# Patient Record
Sex: Male | Born: 1950 | Race: White | Hispanic: No | Marital: Married | Smoking: Current some day smoker
Health system: Southern US, Community
[De-identification: ages and names within clinical notes are randomized; demographics above are authoritative.]

## PROBLEM LIST (undated history)

## (undated) DIAGNOSIS — I2510 Atherosclerotic heart disease of native coronary artery without angina pectoris: Secondary | ICD-10-CM

## (undated) DIAGNOSIS — I739 Peripheral vascular disease, unspecified: Secondary | ICD-10-CM

## (undated) DIAGNOSIS — E291 Testicular hypofunction: Secondary | ICD-10-CM

## (undated) DIAGNOSIS — I10 Essential (primary) hypertension: Secondary | ICD-10-CM

## (undated) DIAGNOSIS — D510 Vitamin B12 deficiency anemia due to intrinsic factor deficiency: Secondary | ICD-10-CM

## (undated) DIAGNOSIS — E785 Hyperlipidemia, unspecified: Secondary | ICD-10-CM

## (undated) DIAGNOSIS — I340 Nonrheumatic mitral (valve) insufficiency: Secondary | ICD-10-CM

## (undated) DIAGNOSIS — Z720 Tobacco use: Secondary | ICD-10-CM

## (undated) DIAGNOSIS — E119 Type 2 diabetes mellitus without complications: Secondary | ICD-10-CM

## (undated) DIAGNOSIS — E039 Hypothyroidism, unspecified: Secondary | ICD-10-CM

## (undated) HISTORY — DX: Testicular hypofunction: E29.1

## (undated) HISTORY — PX: CHOLECYSTECTOMY: SHX55

## (undated) HISTORY — DX: Nonrheumatic mitral (valve) insufficiency: I34.0

## (undated) HISTORY — DX: Hypothyroidism, unspecified: E03.9

## (undated) HISTORY — DX: Essential (primary) hypertension: I10

## (undated) HISTORY — PX: HAND TENDON SURGERY: SHX663

## (undated) HISTORY — DX: Vitamin B12 deficiency anemia due to intrinsic factor deficiency: D51.0

## (undated) HISTORY — DX: Type 2 diabetes mellitus without complications: E11.9

## (undated) HISTORY — DX: Peripheral vascular disease, unspecified: I73.9

## (undated) HISTORY — DX: Hyperlipidemia, unspecified: E78.5

## (undated) HISTORY — PX: CARDIAC CATHETERIZATION: SHX172

## (undated) HISTORY — DX: Tobacco use: Z72.0

## (undated) HISTORY — DX: Atherosclerotic heart disease of native coronary artery without angina pectoris: I25.10

---

## 2006-09-05 ENCOUNTER — Other Ambulatory Visit: Payer: Self-pay

## 2006-09-05 ENCOUNTER — Ambulatory Visit: Payer: Self-pay | Admitting: General Surgery

## 2006-09-06 ENCOUNTER — Ambulatory Visit: Payer: Self-pay | Admitting: General Surgery

## 2008-03-11 ENCOUNTER — Emergency Department: Payer: Self-pay | Admitting: Emergency Medicine

## 2008-03-13 ENCOUNTER — Ambulatory Visit: Payer: Self-pay | Admitting: Emergency Medicine

## 2009-12-22 ENCOUNTER — Ambulatory Visit: Payer: Self-pay | Admitting: General Surgery

## 2009-12-23 ENCOUNTER — Ambulatory Visit: Payer: Self-pay | Admitting: General Surgery

## 2014-09-23 ENCOUNTER — Emergency Department: Payer: Self-pay | Admitting: Emergency Medicine

## 2015-03-06 ENCOUNTER — Encounter: Payer: Self-pay | Admitting: Physician Assistant

## 2015-03-06 ENCOUNTER — Other Ambulatory Visit: Payer: Self-pay | Admitting: Physician Assistant

## 2015-03-06 ENCOUNTER — Observation Stay
Admit: 2015-03-06 | Disposition: A | Discharge status: Home / Self Care | Payer: Self-pay | Attending: Internal Medicine | Admitting: Internal Medicine

## 2015-03-06 DIAGNOSIS — E039 Hypothyroidism, unspecified: Secondary | ICD-10-CM | POA: Insufficient documentation

## 2015-03-06 DIAGNOSIS — R079 Chest pain, unspecified: Secondary | ICD-10-CM | POA: Diagnosis not present

## 2015-03-06 DIAGNOSIS — E291 Testicular hypofunction: Secondary | ICD-10-CM | POA: Insufficient documentation

## 2015-03-06 DIAGNOSIS — I10 Essential (primary) hypertension: Secondary | ICD-10-CM | POA: Diagnosis not present

## 2015-03-06 DIAGNOSIS — I340 Nonrheumatic mitral (valve) insufficiency: Secondary | ICD-10-CM | POA: Diagnosis not present

## 2015-03-06 DIAGNOSIS — E785 Hyperlipidemia, unspecified: Secondary | ICD-10-CM | POA: Diagnosis not present

## 2015-03-06 DIAGNOSIS — Z720 Tobacco use: Secondary | ICD-10-CM | POA: Insufficient documentation

## 2015-03-06 DIAGNOSIS — I2510 Atherosclerotic heart disease of native coronary artery without angina pectoris: Secondary | ICD-10-CM | POA: Diagnosis not present

## 2015-03-06 DIAGNOSIS — E119 Type 2 diabetes mellitus without complications: Secondary | ICD-10-CM | POA: Insufficient documentation

## 2015-03-06 LAB — CBC
HCT: 47 % (ref 40.0–52.0)
HGB: 15.5 g/dL (ref 13.0–18.0)
MCH: 32.9 pg (ref 26.0–34.0)
MCHC: 33 g/dL (ref 32.0–36.0)
MCV: 100 fL (ref 80–100)
Platelet: 247 10*3/uL (ref 150–440)
RBC: 4.72 10*6/uL (ref 4.40–5.90)
RDW: 14.1 % (ref 11.5–14.5)
WBC: 11.3 10*3/uL — AB (ref 3.8–10.6)

## 2015-03-06 LAB — PROTIME-INR
INR: 1
PROTHROMBIN TIME: 13.8 s

## 2015-03-06 LAB — HEPATIC FUNCTION PANEL A (ARMC)
ALBUMIN: 4 g/dL
AST: 20 U/L
Alkaline Phosphatase: 44 U/L
Bilirubin,Total: 0.2 mg/dL — ABNORMAL LOW
SGPT (ALT): 14 U/L — ABNORMAL LOW
Total Protein: 6.8 g/dL

## 2015-03-06 LAB — BASIC METABOLIC PANEL
ANION GAP: 8 (ref 7–16)
BUN: 12 mg/dL
CALCIUM: 9.3 mg/dL
CHLORIDE: 102 mmol/L
Co2: 28 mmol/L
Creatinine: 1.02 mg/dL
Glucose: 109 mg/dL — ABNORMAL HIGH
POTASSIUM: 3.5 mmol/L
SODIUM: 138 mmol/L

## 2015-03-06 LAB — LIPID PANEL
Cholesterol: 119 mg/dL
HDL Cholesterol: 37 mg/dL — ABNORMAL LOW
LDL CHOLESTEROL, CALC: 68 mg/dL
TRIGLYCERIDES: 72 mg/dL
VLDL Cholesterol, Calc: 14 mg/dL

## 2015-03-06 LAB — TROPONIN I
Troponin-I: <0.03 ng/mL
Troponin-I: <0.03 ng/mL
Troponin-I: <0.03 ng/mL

## 2015-03-07 ENCOUNTER — Telehealth: Payer: Self-pay

## 2015-03-07 NOTE — Telephone Encounter (Signed)
Attempted to contact pt regarding discharge from [facility] on 03/06/15. Left detailed message asking pt to call back w/ any questions or concerns regarding discharge instructions or medications.  Advised pt of appt w/ Dr. Kirke CorinArida 03/13/15 @ 9:15. Asked him to call back if he is unable to keep this appt.

## 2015-03-12 ENCOUNTER — Encounter: Payer: Self-pay | Admitting: *Deleted

## 2015-03-13 ENCOUNTER — Ambulatory Visit (INDEPENDENT_AMBULATORY_CARE_PROVIDER_SITE_OTHER): Payer: BLUE CROSS/BLUE SHIELD | Admitting: Cardiovascular Disease

## 2015-03-13 ENCOUNTER — Encounter: Payer: Self-pay | Admitting: Cardiovascular Disease

## 2015-03-13 ENCOUNTER — Encounter (INDEPENDENT_AMBULATORY_CARE_PROVIDER_SITE_OTHER): Payer: Self-pay

## 2015-03-13 VITALS — BP 122/66 | HR 88 | Ht 74.0 in | Wt 211.2 lb

## 2015-03-13 DIAGNOSIS — I10 Essential (primary) hypertension: Secondary | ICD-10-CM | POA: Diagnosis not present

## 2015-03-13 DIAGNOSIS — E785 Hyperlipidemia, unspecified: Secondary | ICD-10-CM | POA: Diagnosis not present

## 2015-03-13 DIAGNOSIS — I25118 Atherosclerotic heart disease of native coronary artery with other forms of angina pectoris: Secondary | ICD-10-CM | POA: Diagnosis not present

## 2015-03-13 DIAGNOSIS — I340 Nonrheumatic mitral (valve) insufficiency: Secondary | ICD-10-CM

## 2015-03-13 DIAGNOSIS — R079 Chest pain, unspecified: Secondary | ICD-10-CM

## 2015-03-13 MED ORDER — NITROGLYCERIN 0.4 MG SL SUBL: 0.4000 mg | SUBLINGUAL_TABLET | SUBLINGUAL | Status: DC | PRN

## 2015-03-13 NOTE — Assessment & Plan Note (Signed)
Continue treatment with pravastatin with a target LDL of less than 70. 

## 2015-03-13 NOTE — Progress Notes (Signed)
Primary care physician: Dr. Ronnald Collum  HPI  This is a pleasant 64 year old male who is here today for a follow-up visit after recent hospitalization at [facility]. He has known history of one-vessel coronary artery disease by cardiac catheterization in 2012 at [facility], hypertension, type 2 diabetes, hyperlipidemia, hypothyroidism, hypogonadism and tobacco use. The patient had chest pain back in 2012. He underwent a nuclear stress test which was abnormal. He underwent cardiac catheterization at [facility] which showed mild 20% left main stenosis, 20% diffuse LAD disease, 20% left circumflex stenosis and subtotally occluded proximal right coronary artery with left-to-right collaterals. Ejection fraction was normal. He was treated medically. He is usually active and walks approximately 20-25 miles every week for exercise with no significant symptoms. However, on the evening of March 30, he started his usual walk and then became suddenly short of breath with substernal chest tightness radiating to left posterior neck and left arm. The pain lasted for about 4 hours and he went to the emergency room at [facility]. He ruled out for myocardial infarction. EKG showed no acute changes. Echocardiogram showed normal LV systolic function, mild left ventricular hypertrophy, moderate mitral regurgitation and aortic valve sclerosis without stenosis. He underwent a treadmill nuclear stress test which showed no evidence of ischemia with normal ejection fraction. His blood pressure was elevated. Lisinopril and amlodipine were both added.  Allergies  Allergen Reactions  . Morphine Rash     Current Outpatient Prescriptions on File Prior to Visit  Medication Sig Dispense Refill  . ALPRAZolam (XANAX) 0.5 MG tablet Take 0.5 mg by mouth at bedtime as needed for anxiety.    Marland Kitchen amLODipine (NORVASC) 5 MG tablet Take 5 mg by mouth daily.    . cyanocobalamin (,VITAMIN B-12,) 1000 MCG/ML injection Inject 1,000 mcg into the muscle once.    Marland Kitchen  levothyroxine (SYNTHROID, LEVOTHROID) 200 MCG tablet Take 200 mcg by mouth daily before breakfast.    . lisinopril (PRINIVIL,ZESTRIL) 10 MG tablet Take 10 mg by mouth daily.    . metoprolol tartrate (LOPRESSOR) 25 MG tablet Take 25 mg by mouth 2 (two) times daily.    . pravastatin (PRAVACHOL) 10 MG tablet Take 10 mg by mouth daily.    . Testosterone Cypionate 200 MG/ML KIT Inject into the muscle every 3 (three) months.    . Vitamin D, Ergocalciferol, (DRISDOL) 50000 UNITS CAPS capsule Take 50,000 Units by mouth every 7 (seven) days.    Marland Kitchen zolpidem (AMBIEN) 10 MG tablet Take 10 mg by mouth at bedtime.     No current facility-administered medications on file prior to visit.     Past Medical History  Diagnosis Date  . HTN (hypertension)   . Tobacco abuse   . DM2 (diabetes mellitus, type 2)   . Hypogonadism in male   . Hypothyroidism   . Mitral regurgitation     a. echo 02/2015: EF 55-60%, mild LVH, moderate MR, mild aortic sclerosis without stenosis, mild TR  . Pernicious anemia   . Hypogonadism male   . CAD (coronary artery disease)     a. cath 10/20/2011: LM 20%, LAD diffuse 20%, LCx diffuse 20%, sub totally occluded pRCA 90% extending to mRCA 99% w/ left to right collats, med Rx recommeded, EF 87%.   Marland Kitchen HLD (hyperlipidemia)      Past Surgical History  Procedure Laterality Date  . Cholecystectomy    . Cardiac catheterization      [facility]  . Cardiac catheterization      [facility]  Family History  Problem Relation Age of Onset  . Family history unknown: Yes     History   Social History  . Marital Status: Married    Spouse Name: N/A  . Number of Children: N/A  . Years of Education: N/A   Occupational History  . Not on file.   Social History Main Topics  . Smoking status: Current Every Day Smoker -- 0.25 packs/day for 40 years    Types: Cigarettes  . Smokeless tobacco: Not on file  . Alcohol Use: No  . Drug Use: No  . Sexual Activity: Not on file   Other Topics  Concern  . Not on file   Social History Narrative       PHYSICAL EXAM   BP 122/66 mmHg  Pulse 88  Ht 6' 2"  (1.88 m)  Wt 211 lb 4 oz (95.822 kg)  BMI 27.11 kg/m2 Constitutional: He is oriented to person, place, and time. He appears well-developed and well-nourished. No distress.  HENT: No nasal discharge.  Head: Normocephalic and atraumatic.  Eyes: Pupils are equal and round.  No discharge. Neck: Normal range of motion. Neck supple. No JVD present. No thyromegaly present.  Cardiovascular: Normal rate, regular rhythm, normal heart sounds. Exam reveals no gallop and no friction rub. No murmur heard.  Pulmonary/Chest: Effort normal and breath sounds normal. No stridor. No respiratory distress. He has no wheezes. He has no rales. He exhibits no tenderness.  Abdominal: Soft. Bowel sounds are normal. He exhibits no distension. There is no tenderness. There is no rebound and no guarding.  Musculoskeletal: Normal range of motion. He exhibits no edema and no tenderness.  Neurological: He is alert and oriented to person, place, and time. Coordination normal.  Skin: Skin is warm and dry. No rash noted. He is not diaphoretic. No erythema. No pallor.  Psychiatric: He has a normal mood and affect. His behavior is normal. Judgment and thought content normal.       [id number]  Rhythm  WITHIN NORMAL LIMITS   ASSESSMENT AND PLAN

## 2015-03-13 NOTE — Assessment & Plan Note (Signed)
The patient continues to have some substernal chest discomfort but this has not been consistently exertional with some atypical features. Recent cardiac workup including an echocardiogram and a nuclear stress test both were unremarkable. He can resume his physical activities and monitor symptoms. Given that he still has some chest pain, I elected to keep him off work for another week until April 18. He can resume work that day with no restrictions. I explained to him that if he continues to have these symptoms, the next step would be to proceed with cardiac catheterization.

## 2015-03-13 NOTE — Assessment & Plan Note (Signed)
Continue to monitor. He has no murmurs by exam.

## 2015-03-13 NOTE — Patient Instructions (Addendum)
Continue same medication. I sent a prescription for Nitroglycerine .   You can resume work on April 18 th.   Follow up in 1 month.

## 2015-03-13 NOTE — Assessment & Plan Note (Signed)
Blood pressure is now well controlled after the addition of lisinopril and amlodipine.

## 2015-04-06 NOTE — Consult Note (Signed)
General Aspect Primary Cardiologist: New to [facility] _____________  64 year old male with history of 1 vessel CAD by cardiac cath 10/20/2011 treated medically, HTN, DM2, HLD, hypothyroidism, hypogonadism, and ongoing tobacco abuse who presented to [facility] on 03/05/2015 with exertional left sided chest pain that radiated to the posterior left neck and down his left arm. EKG non acute and troponin negative x 2 thus far. _____________  PMH: 1. 1 vessel CAD by cardiac cath 10/20/2011 treated medically 2. HTN 3. DM2 (diet controlled currently) 4. HLD 5. Hypothyroidism 6. Hypogonadism 7. Ongoing tobacco abuse ____________   Present Illness 64 year old male with the above problem list who presented to [facility] on 3/30 with the above CC. He has known 1 vessel CAD dating back to 10/20/2011. At that time he underwent nuclear stress testing for exertional chest pain that was abnormal. Because of that he underwent cardiac cath at [facility] which showed LM 20% stenosis, LAD with diffuse 20% stenosis, LCx 20% stenosis, subtotally occluded pRCA 90% stenosis that extended to the [facility] which was 99% stenosed with left to right collaterals. Medical treatment was recommended. He has not has any further cardiac evaluation or intervention since.   He walks almost every evening and tries to walk approximately 20-25 miles each week. He previously has not had any chest pain with any of his walks. He smokes less than 1/2 ppd since the [place] War. He denies any family history of significant CAD. He was previously on metformin for his DM2, but has since been taken off this and is now diet controlled. He reports his most recent A1C as being 5.3%.   On the evening of 3/30 he began his usual walk. Just after beginning this walk he became somewhat SOB. This SOB improved as his walk continued. A short ways into his walk he began to develop some left sided chest pain. Pain radiated to the left posterior neck and down the left arm. He reports  it is not unusual for him to have left arm pain as he does have chronic left arm pain. He continued on with his walk and walked approximately 2 miles. Upon returning home he sat on his porch. It was when he stood up when the pain worsened. He did did not want to come to the hospital, however he reports his wife made him. He denies any associated nausea, vomiting, diaphoresis, palpitations, presyncope, or syncope. The pain was an ache and lasted approximately 4-5 hours until he arrived at [facility] and received SL NTG x 2. He did take a baby aspirin at home without help.   Upon his arrival to [facility] ED he was found to have troponin negative x 3, EKG NSR, 90 bpm, no st/t changes. CXR with atelectasis. Echo showed EF 55-60%, mild LVH, moderate MR, mild TR, mild aortic valve sclerosis without stenosis. He has been chest pain free since receiving the above SL NTG. He did have a couple of bites of egg this morning. That was the frist thing he has eaten in almost 24 hours.   Physical Exam:  GEN no acute distress   HEENT PERRL, hearing intact to voice, moist oral mucosa   NECK supple  trachea midline  no JVD or bruits   RESP normal resp effort  clear BS   CARD Regular rate and rhythm  Normal, S1, S2  Murmur   Murmur Systolic   ABD denies tenderness  soft   EXTR negative edema   SKIN normal to palpation  NEURO cranial nerves intact   PSYCH alert, A+O to time, place, person   Review of Systems:  General: No Complaints   Skin: No Complaints   ENT: No Complaints   Eyes: No Complaints   Neck: No Complaints   Respiratory: Short of breath   Cardiovascular: Chest pain or discomfort   Gastrointestinal: No Complaints   Genitourinary: No Complaints   Vascular: No Complaints   Musculoskeletal: No Complaints   Neurologic: No Complaints   Hematologic: No Complaints   Endocrine: No Complaints   Psychiatric: No Complaints   Review of Systems: All other systems were reviewed and found to  be negative   Medications/Allergies Reviewed Medications/Allergies reviewed   Family & Social History:  Family and Social History:  Family History Hypertension  Diabetes Mellitus   Social History positive  tobacco (Current within 1 year), negative ETOH, negative Illicit drugs   + Tobacco Current (within 1 year)  less than 1/2 ppd since the 1960's   Place of Living Home     cad:    Hypothyroidism:    Cholecystectomy:    Gall Bladder:   Home Medications: Medication Instructions Status  Synthroid 200 mcg (0.2 mg) oral tablet 1 tab(s) orally once a day Active  Vitamin D2 50,000 intl units oral capsule 1 cap(s) orally once a week Active  Vitamin B-12 1000 mcg/mL injectable solution 1 dose(s) injectable once a month Active  Testosterone Cypionate cypionate 200 mg/mL intramuscular solution 1 dose(s) intramuscular every 2 weeks Active  zolpidem 10 mg oral tablet 1 tab(s) orally once a day (at bedtime) Active  ALPRAZolam 0.5 mg oral tablet 1 tab(s) orally once a day (at bedtime), As Needed Active  pravastatin 10 mg oral tablet 1 tab(s) orally once a day (at bedtime) Active  Metoprolol Tartrate 25 mg oral tablet 1 tab(s) orally 2 times a day Active   Lab Results:  Hepatic:  31-Mar-16 03:11   Bilirubin, Total  0.2 (0.3-1.2 NOTE: New Reference Range  02/11/15)  SGPT (ALT)  14 (17-63 NOTE: New Reference Range  02/11/15)  SGOT (AST) 20 (15-41 NOTE: New Reference Range  02/11/15)  Routine Chem:  30-Mar-16 23:22   Glucose, Serum  109 (65-99 NOTE: New Reference Range  02/11/15)  BUN 12 (6-20 NOTE: New Reference Range  02/11/15)  Creatinine (comp) 1.02 (0.61-1.24 NOTE: New Reference Range  02/11/15)  Sodium, Serum 138 (135-145 NOTE: New Reference Range  02/11/15)  Potassium, Serum 3.5 (3.5-5.1 NOTE: New Reference Range  02/11/15)  Chloride, Serum 102 (101-111 NOTE: New Reference Range  02/11/15)  CO2, Serum 28 (22-32 NOTE: New Reference Range  02/11/15)  Calcium  (Total), Serum 9.3 (8.9-10.3 NOTE: New Reference Range  02/11/15)  Anion Gap 8  eGFR (African American) >60  eGFR (Non-African American) >60 (eGFR values <78m/min/1.73 m2 may be an indication of chronic kidney disease (CKD). Calculated eGFR is useful in patients with stable renal function. The eGFR calculation will not be reliable in acutely ill patients when serum creatinine is changing rapidly. It is not useful in patients on dialysis. The eGFR calculation may not be applicable to patients at the low and high extremes of body sizes, pregnant women, and vegetarians.)  Cardiac:  30-Mar-16 23:22   Troponin I <0.03 (0.00-0.03 0.03 ng/mL or less: NEGATIVE  Repeat testing in 3-6 hrs  if clinically indicated. >0.05 ng/mL: POTENTIAL  MYOCARDIAL INJURY. Repeat  testing in 3-6 hrs if  clinically indicated. NOTE: An increase or decrease  of 30% or more on serial  testing suggests a  clinically important change NOTE: New Reference Range  02/11/15)  31-Mar-16 03:11   Troponin I <0.03 (0.00-0.03 0.03 ng/mL or less: NEGATIVE  Repeat testing in 3-6 hrs  if clinically indicated. >0.05 ng/mL: POTENTIAL  MYOCARDIAL INJURY. Repeat  testing in 3-6 hrs if  clinically indicated. NOTE: An increase or decrease  of 30% or more on serial  testing suggests a  clinically important change NOTE: New Reference Range  02/11/15)    07:08   Troponin I <0.03 (0.00-0.03 0.03 ng/mL or less: NEGATIVE  Repeat testing in 3-6 hrs  if clinically indicated. >0.05 ng/mL: POTENTIAL  MYOCARDIAL INJURY. Repeat  testing in 3-6 hrs if  clinically indicated. NOTE: An increase or decrease  of 30% or more on serial  testing suggests a  clinically important change NOTE: New Reference Range  02/11/15)  Routine Hem:  30-Mar-16 23:22   WBC (CBC)  11.3  RBC (CBC) 4.72  Hemoglobin (CBC) 15.5  Hematocrit (CBC) 47.0  Platelet Count (CBC) 247 (Result(s) reported on 06 Mar 2015 at 12:02AM.)  MCV 100  MCH 32.9   MCHC 33.0  RDW 14.1   EKG:  EKG Interp. by me   Interpretation NSR, 90 bpm, no significant st/t chnges   Radiology Results: XRay:    31-Mar-16 00:01, Chest Portable Single View  Chest Portable Single View   REASON FOR EXAM:    chest pain  COMMENTS:       PROCEDURE: DXR - DXR PORTABLE CHEST SINGLE VIEW  - Mar 06 2015 12:01AM     CLINICAL DATA:  Acute onset of left-sided chest and neck pain.  Dizziness. Initial encounter.    EXAM:  PORTABLE CHEST - 1 VIEW    COMPARISON:  Chest radiograph performed 12/22/2009    FINDINGS:  The lungs are well-aerated. Mild bibasilar airspace opacity likely  reflects atelectasis. There is no evidence of pleural effusion or  pneumothorax.    The cardiomediastinal silhouette is borderline normal in size. No  acute osseous abnormalities are seen.     IMPRESSION:  Mild bibasilar airspace opacity likely reflects atelectasis.      Electronically Signed    By: Garald Balding M.D.    On: 03/06/2015 00:37       Verified By: JEFFREY . Radene Knee, M.D.,  Cardiology:    31-Mar-16 07:19, Echo Doppler  Echo Doppler   REASON FOR EXAM:      COMMENTS:       PROCEDURE: [facility] - ECHO DOPPLER COMPLETE(TRANSTHOR)  - Mar 06 2015  7:19AM     RESULT: Echocardiogram Report    Patient Name:   DELLA HOMAN Date of Exam: 03/06/2015  Medical Rec #:  [id number]          Custom1:  Date of Birth:  1951-01-13      Height:       74.0 in  Patient Age:    61 years        Weight:       210.0 lb  Patient Gender: M               BSA:          2.22 m??    Indications: Angina  Sonographer:    Sherrie Sport RDCS  Referring Phys: Juluis Mire    Summary:   1. Left ventricular ejection fraction, by visual estimation, is 55 to   60%.   2. Normal global left ventricular systolic function.   3. Mild concentric left  ventricular hypertrophy.   4. Mildly dilated left atrium.   5. Moderate mitral valve regurgitation.   6. Mildly increased left ventricular internal cavity  size.   7. Mild aortic valve sclerosis without stenosis.   8. Mild tricuspid regurgitation.  2D AND M-MODE MEASUREMENTS (normal ranges within parentheses):  Left Ventricle:         Normal  IVSd (2D):      1.41 cm (0.7-1.1)  LVPWd (2D):     1.21 cm (0.7-1.1) Aorta/LA:                  Normal  LVIDd (2D):     5.31 cm (3.4-5.7) Aortic Root (2D): 1.55 cm (2.4-3.7)  LVIDs (2D):     2.88 cm           Left Atrium (2D): 4.50 cm (1.9-4.0)  LV FS (2D):     45.8 %   (>25%)  LV EF (2D):     76.7 %   (>50%)                                    Right Ventricle:                                    RVd (2D):        8.41 cm  LV DIASTOLIC FUNCTION:  MV Peak E: 0.76 m/s E/e' Ratio: 14.40  MV Peak A: 0.68 m/s Decel Time: 222 msec  E/A Ratio: 1.12  SPECTRAL DOPPLER ANALYSIS (where applicable):  Mitral Valve:  MV P1/2 Time: 64.38 msec  MV Area, PHT: 3.42 cm??  Aortic Valve: AoV Max Vel: 1.17 m/s AoV Peak PG: 5.4 mmHg AoV Mean PG:  LVOT Vmax: 0.97 m/s LVOT VTI:  LVOT Diameter: 2.10 cm  AoV Area, Vmax: 2.89 cm?? AoV Area, VTI:  AoV Area, Vmn:  Tricuspid Valve and PA/RV Systolic Pressure: TR Max Velocity: 2.06 m/s RA   Pressure: 5 mmHg RVSP/PASP: 22.1 mmHg  Pulmonic Valve:  PV Max Velocity: 1.05 m/s PV Max PG: 4.4 mmHg PV Mean PG:    PHYSICIAN INTERPRETATION:  Left Ventricle: The left ventricular internal cavity size was mildly   increased. Mild concentric left ventricular hypertrophy. Global LV   systolic function was normal. Left ventricular ejection fraction, by   visual estimation, is 55 to 60%. Spectral Doppler shows normal pattern of   LV diastolic filling.  Right Ventricle: Normal right ventricular size, wall thickness, and     systolic function.  Left Atrium: The left atrium is mildly dilated.  Right Atrium: The right atrium is normal in size.  Pericardium: There is no evidence of pericardial effusion.  Mitral Valve: The mitral valve is normal in structure. No evidence of   mitral valve  stenosis. Moderate mitral valve regurgitation is seen.  Tricuspid Valve: The tricuspid valve is normal. Mild tricuspid   regurgitation is visualized. The tricuspid regurgitant velocity is 2.06   m/s, and with an assumed right atrial pressure of 5 mmHg, the estimated   right ventricular systolic pressure is normal at 22.1 mmHg.  Aortic Valve: The aortic valve is normal. Mild aortic valve sclerosis is   present, with no evidence of aortic valve stenosis. No evidence of aortic   valve regurgitation is seen.  Pulmonic Valve: The pulmonic valve is normal. No indication of pulmonic   valve  regurgitation.  Aorta: The aortic root is normal in size and structure.  Venous: The inferior vena cava was not well visualized.    [id number] Kathlyn Sacramento MD  Electronically signed by [id number] Kathlyn Sacramento MD  Signature Date/Time: 03/06/2015/8:00:26 AM    *** Final ***    IMPRESSION: .        Verified By: Mertie Clause. ARIDA, M.D., MD    Morphine: Rash  Vital Signs/Nurse's Notes: **Vital Signs.:   31-Mar-16 04:59  Vital Signs Type Admission  Temperature Temperature (F) 98.2  Celsius 36.7  Temperature Source oral  Pulse Pulse 68  Respirations Respirations 18  Systolic BP Systolic BP 373  Diastolic BP (mmHg) Diastolic BP (mmHg) 77  Mean BP 110  Pulse Ox % Pulse Ox % 100  Pulse Ox Activity Level  At rest  Oxygen Delivery Room Air/ 21 %    Impression 64 year old male with history of 1 vessel CAD by cardiac cath 10/20/2011 treated medically, HTN, DM2, HLD, hypothyroidism, hypogonadism, and ongoing tobacco abuse who presented to [facility] on 03/05/2015 with exertional left sided chest pain that radiated to the posterior left neck and down his left arm. EKG non acute and troponin negative x 2 thus far.  1. Chest pain with moderate risk of cardiac etiology: -Troponin negative x 3, EKG non acute -Echo with normal LV function -This is his first episode of chest pain since 2012 which led to his cardiac cath at  that time described above -Plan for Treadmill Myoview this afternoon to evaluate for high risk stenosis (he ate a couple of bites of egg this morning)  2. CAD: -Continue Lopressor 25 mg bid -Change praavastatin to Lipitor 40 mg -Continue aspirin 81 mg daily -Smoking cessation  3. HTN: -Uncontrolled -Add lisinopril 10 mg daily -Add Norvasc 5 mg daily -Continue Lopressor 25 mg bid -Needs recheck bmet 1 week after starting ACEi  4. DM2: -Reports A1C of 5.3% -Check A1C for risk stratification  5. HLD: -Check FLP -Change statin as above  6. Hypothyroidism: -On Synthroid  7. Tobacco abuse: -Cessation is a must   Clinical biochemist for Addendum Section:  Kathlyn Sacramento (MD) (Signed Addendum 31-Mar-16 09:43)  The patient was seen and examined. Agree with the above. He has known history of 1 vessel CAD in 2012 treated medically. He is usually active with no exertional symptoms. However, he had a prolonged episode of chest pain last evening while walking. ECG with no ischemic changes. TnI is negative.  Plan treatmil nuclear stress test today.   Electronic Signatures: Kathlyn Sacramento (MD)  (Signed 31-Mar-16 09:43)  Co-Signer: General Aspect/Present Illness, Home Medications, Allergies Idolina Primer, Kimbria Camposano M (PA-C)  (Signed 31-Mar-16 09:06)  Authored: General Aspect/Present Illness, History and Physical Exam, Review of System, Family & Social History, Past Medical History, Home Medications, Labs, EKG , Radiology, Allergies, Vital Signs/Nurse's Notes, Impression/Plan   Last Updated: 31-Mar-16 09:43 by Kathlyn Sacramento (MD)

## 2015-04-06 NOTE — Discharge Summary (Signed)
PATIENT NAME:  Tyrone Blankenship, [name] MR#:  [id number] DATE OF BIRTH:  10/19/1951  DATE OF ADMISSION:  03/06/2015 DATE OF DISCHARGE:  03/06/2015  For a detailed note, please see the history and physical done on admission by Dr. Betti Cruzeddy.   DIAGNOSES AT DISCHARGE: Chest pain, atypical, likely non-cardiac, hypertension, hyperlipidemia, hypothyroidism, anxiety, history of tobacco abuse.   The patient is being discharged on a low-sodium, low-fat diet.   ACTIVITY: As tolerated.  Followup is with Dr. Patrecia PaceMorayati and also Dr. Kirke CorinArida in the next 1 to 2 weeks.   DISCHARGE MEDICATIONS: Pravachol 10 mg daily, metoprolol tartrate 25 mg b.i.d., Synthroid 200 mcg daily, vitamin D2 at 50,000 international units weekly, vitamin B12 at 1000 mcg monthly, testosterone intramuscular every 2 weeks, Ambien 10 mg at bedtime, Xanax 0.5 mg at bedtime as needed, lisinopril 10 mg daily, amlodipine 5 mg daily.  CONSULTANT DURING THE HOSPITAL COURSE: Muhammad A. Kirke CorinArida, MD, from cardiology.   PERTINENT STUDIES DONE DURING THE HOSPITAL COURSE: As follows: A chest x-ray done on admission showing no acute cardiopulmonary disease. A nuclear medicine myocardial scan done on March 31 showing an exercise myocardial perfusion study with no significant ischemia, No significant wall motion abnormality. Ejection fraction of 68%. No EKG changes.   HOSPITAL COURSE: This is a 64 year old male with medical problems as mentioned above, who presented to the hospital with chest pain and left upper extremity numbness.    PROBLEM LIST: 1. Chest pain. The patient does have significant risk factors for coronary artery disease given history of tobacco abuse, hypertension, history of coronary artery disease in the past. He presented with chest pains and, therefore, was observed overnight on telemetry, had 3 sets of cardiac markers checked which were negative. The patient was seen by cardiology. They recommended a nuclear medicine myocardial scan which was  performed, which showed no evidence of acute ischemia. The patient was clinically asymptomatic and was maintained on aspirin, beta blocker and statin while in the hospital and, since he states he is clinically stable with a negative nuclear medicine myocardial scan, he is being discharged home.  2. Hypertension. The patient remained hemodynamically stable. Will continue metoprolol and lisinopril. 3. Hyperlipidemia. The patient was maintained on his Pravachol, and he will resume that.  4. Hypothyroidism. The patient was maintained on his Synthroid. He will resume that. 5. Anxiety. The patient was maintained on Xanax, and he will resume that upon discharge too.  The patient is a full code.  TIME SPENT TO DISCHARGE: 35 minutes.    ____________________________ Rolly PancakeVivek J. Cherlynn KaiserSainani, MD vjs:jh D: 03/07/2015 08:36:49 ET T: 03/07/2015 12:16:09 ET JOB#: [id number]  cc: Rolly PancakeVivek J. Cherlynn KaiserSainani, MD, <Dictator> Alan MulderShamil J. Morayati, MD Chelsea AusMuhammad A. Kirke CorinArida, MD  Houston SirenVIVEK J Topanga Alvelo MD ELECTRONICALLY SIGNED 03/13/2015 16:22

## 2015-04-06 NOTE — H&P (Signed)
PATIENT NAME:  Tyrone Blankenship, Tyrone Blankenship MR#:  [id number] DATE OF BIRTH:  12/25/1950  DATE OF ADMISSION:  03/05/2015  ADMITTING PHYSICIAN:  Enedina Finner. Manson Passey, MD  PRIMARY CARE PRACTITIONER:  Alan Mulder, MD  ADMITTING PHYSICIAN:  Crissie Figures, MD   CHIEF COMPLAINT:  Exertional chest pain.    HISTORY OF PRESENT ILLNESS:  A 64 year old Caucasian male with a history of hypertension and coronary artery disease, status post angiogram 2 years ago, which revealed blockage of 2 small vessels, pernicious anemia, hypothyroidism, hypertension, hyperlipidemia, diet-controlled diabetes mellitus, tobacco usage, and hypogonadism, who presents to the Emergency Room with the complaints of exertional chest pain. The patient stated that while he went to walk last evening.  He experienced sudden onset of retrosternal left-sided chest pain with radiation to the left trauma and jaw, associated with some mild diaphoresis, hence came to the Emergency Room for further evaluation. The patient continued to have chest pain until he came to the Emergency Room. The patient was evaluated by the ED physician and had a normal EKG and the first set of cardiac enzymes were negative. On presentation, his systolic blood pressure was elevated. The patient was given aspirin sublingual nitroglycerin following which his chest pain eased and currently he is chest pain-free. Denies any palpitations. No dizziness or loss of consciousness. No recent fever, cough, or any illnesses. No nausea, vomiting, diarrhea, abdominal pain. No urinary symptoms. The patient reports history of coronary artery disease, and he states he has undergone cardiac catheterization and coronary angiogram 2 years ago at [facility], which revealed 100% blockage of 2 small vessels, which were nonstentable and maintained on medical management. He did not have any chest pains up until this episode, which happened last night.    PAST MEDICAL HISTORY: 1.  Hypertension.  2.   Coronary artery disease and history of cardiac catheterization with coronary angiogram done about 2 years ago, which the patient reports as 100% blockage of 2 small vessels.  3.  Hyperlipidemia.  4.  Diabetes mellitus type 2, currently diet controlled.  5.  Hypothyroidism.  6.  Pernicious anemia.  7.  Tobacco usage. 8.  Hypogonadism.   PAST SURGICAL HISTORY:  Cholecystectomy.   ALLERGIES:  MORPHINE.   FAMILY HISTORY:  No history of hypertension, diabetes, coronary artery disease, or thyroid problems.   SOCIAL HISTORY:  Married, lives at home with his wife. History of smoking about 1/2 pack per day for the past many years. Denies any alcohol or substance abuse.  HOME MEDICATIONS:   1.  Alprazolam 0.5 mg tablet 1 tablet orally once a day at bedtime as needed.  2.  Metoprolol 25 mg 1 tablet orally 2 times a day.  3.  Pravastatin 10 mg 1 tablet orally once a day.  4.  Synthroid 200 mcg 1 tablet orally once a day.  5.  Testosterone 6 unit injection 200 mg intramuscularly one dose every 2 weeks.  6.  Vitamin B12, 1000 mcg injectable solution once a month.  7.  Vitamin D2, 50,000 units 1 capsule orally weekly.  8.  Zolpidem 10 mg 1 tablet orally once a day.   REVIEW OF SYSTEMS: CONSTITUTIONAL:  Negative for fever or chills. No fatigue. No generalized weakness.  EYES:  Negative for blurred vision or double vision. No pain. No redness. No discharge.  EARS, NOSE, AND THROAT:  Negative for tinnitus, ear pain, hearing loss, epistaxis, nasal discharge.  RESPIRATORY:  Negative for cough, wheezing, dyspnea, hemoptysis, or painful respiration.  CARDIOVASCULAR:  Positive for left-sided chest pain with radiation to the left arm and neck as noted in the history of present illness. No associated palpitations, dizziness, syncopal episodes. No orthopnea. No dyspnea on exertion. No pedal edema.  GASTROINTESTINAL:  Negative for nausea, vomiting, diarrhea, abdominal pain, hematemesis, melena, rectal bleeding,  or GERD symptoms.  GENITOURINARY:  Negative for dysuria, frequency, urgency, or hematuria.  ENDOCRINE:  Negative for nocturia, polyuria, or heat or cold intolerance. HEMATOLOGIC AND LYMPHATIC:  Negative for anemia, easy bruising or bleeding, or swollen glands.  INTEGUMENTARY:  Negative for acne, skin rash, or lesions.  MUSCULOSKELETAL:  Negative for neck, shoulder, or back pain. No history of arthritis or gout.  NEUROLOGICAL:  Negative for focal weakness or numbness. No history of tremor, CVA, TIA, or seizure disorder.  PSYCHIATRIC:  Negative for anxiety, insomnia, or depression.   PHYSICAL EXAMINATION: VITAL SIGNS:  Temperature 97.7 degrees Fahrenheit, pulse rate 90 per minute, respirations 20 per minute, blood pressure on arrival 196/88, current blood pressure 146/75, oxygen saturation 97% on room air.  GENERAL:  Well developed, well nourished, alert, in no acute distress, comfortably resting in the bed.  HEAD:  Atraumatic, normocephalic.  EYES:  Pupils are equal and react to light and accommodation. No conjunctival pallor. No icterus. Extraocular movements are intact.  NOSE:  No drainage. No lesions.   EARS:  No drainage. No external lesions. ORAL CAVITY:  No mucosal lesions. No exudates.  NECK:  Supple. No JVD. No thyromegaly. No carotid bruit. Range of motion of the neck is within normal limits.  RESPIRATORY:  Good respiratory effort. Not using accessory muscles of respiration. Bilateral vesicular breath sounds. There are no rales or rhonchi.  CARDIOVASCULAR:  S1 and S2 regular. No murmurs, gallops, or clicks. Pulses are equal at carotid, femoral, and pedal pulses. No peripheral edema.  GASTROINTESTINAL:  Abdomen is soft and nontender. No hepatosplenomegaly. No masses. No rigidity. No guarding. Bowel sounds present and equal in all 4 quadrants.  GENITOURINARY:  Deferred.  MUSCULOSKELETAL:  No joint tenderness or effusion.  Range of motion is adequate. Strength and tone are equal  bilaterally.  SKIN:  Inspection within normal limits. No obvious wounds.  LYMPH NODES:  No cervical lymphadenopathy.  VASCULAR:  Good dorsalis pedis and posterior tibial pulses.  NEUROLOGICAL:  Alert, awake, and oriented x 3. Cranial nerves II through XII are grossly intact. No signs or deficits. Motor strength is 5/5 in both upper and lower extremities. Deep tendon reflexes are 2+ bilaterally and symmetrical. Plantars are downgoing.  PSYCHIATRIC:  Alert, awake, and oriented x 3. Judgment and insight are adequate. Memory and mood are within normal limits.   LABORATORY DATA:  Serum glucose is 109, BUN 12, creatinine 1.02, sodium 138, potassium 3.5, chloride 102, bicarbonate 28, and calcium is 9.3. Troponin is less than 0.03. WBC is 11.3, hemoglobin 15.5, hematocrit 47.0, platelet count 247,000.   CHEST X-RAY:  Mild bibasilar airspace opacity, likely reflects atelectasis.   EKG:  Normal sinus rhythm with ventricular rate of 90 beats per minute. No acute ST-T changes.    ASSESSMENT AND PLAN:  A 64 year old Caucasian male with a past medical history of coronary artery disease, hypertension, hyperlipidemia, diet-controlled diabetes mellitus, tobacco usage, hypothyroidism, hypogonadism, pernicious anemia, who presents with exertional chest pain with radiation to her left arm, concerning for unstable angina. EKG:  No acute changes and first set of troponin negative.   1.  Excisional chest pain with radiation to the left arm, concerning for angina. EKG showed no  acute changes. First set of troponin was negative. Plan:  Admit to telemetry for observation, administer aspirin, nitroglycerin, beta blocker, statin, start Lovenox. Cycle cardiac enzymes. Request echocardiogram and cardiology consultation for further evaluation.  2.  History of coronary artery disease, status post coronary angiogram 2 years ago, and the patient reports 100% blockage of 2 smaller vessels on medical management.  3.  Hypertension,  stable on home medications. Continue same.  4.  Diabetes mellitus type 2, off metformin for the past 5 months. The patient is stable. Plan:  Sliding scale insulin. Monitor blood sugars.  5.  Hyperlipidemia, on statin. Continue same.  6.  Hypothyroidism, stable on Synthroid. Continue same.  7.  Tobacco usage, continuous. Counseled to quit. Offered nicotine replacement treatment. The patient is not motivated at this time.  8.  History of pernicious anemia, on monthly B12 shots, stable. Continue same.  9.  Hypogonadism, on testosterone shots q. 2 weeks, hold for now.  10.  Deep vein thrombosis prophylaxis with subcutaneous Lovenox.  11.  Gastrointestinal prophylaxis with ranitidine.   CODE STATUS:  Full code.   TIME SPENT:  50 minutes.    ____________________________ Crissie FiguresEdavally N. Tani Virgo, MD enr:nb D: 03/06/2015 03:37:00 ET T: 03/06/2015 04:16:00 ET JOB#: [id number]  cc: Crissie FiguresEdavally N. Porshea Janowski, MD, <Dictator> Alan MulderShamil J. Morayati, MD Crissie FiguresEDAVALLY N Kallin Henk MD ELECTRONICALLY SIGNED 03/06/2015 18:31

## 2015-04-17 ENCOUNTER — Ambulatory Visit (INDEPENDENT_AMBULATORY_CARE_PROVIDER_SITE_OTHER): Payer: BLUE CROSS/BLUE SHIELD | Admitting: Cardiovascular Disease

## 2015-04-17 ENCOUNTER — Encounter: Payer: Self-pay | Admitting: Cardiovascular Disease

## 2015-04-17 VITALS — BP 140/68 | HR 88 | Ht 74.0 in | Wt 201.8 lb

## 2015-04-17 DIAGNOSIS — I340 Nonrheumatic mitral (valve) insufficiency: Secondary | ICD-10-CM

## 2015-04-17 DIAGNOSIS — I10 Essential (primary) hypertension: Secondary | ICD-10-CM | POA: Diagnosis not present

## 2015-04-17 DIAGNOSIS — I714 Abdominal aortic aneurysm, without rupture, unspecified: Secondary | ICD-10-CM

## 2015-04-17 DIAGNOSIS — I25118 Atherosclerotic heart disease of native coronary artery with other forms of angina pectoris: Secondary | ICD-10-CM | POA: Diagnosis not present

## 2015-04-17 DIAGNOSIS — E785 Hyperlipidemia, unspecified: Secondary | ICD-10-CM

## 2015-04-17 DIAGNOSIS — Z720 Tobacco use: Secondary | ICD-10-CM

## 2015-04-17 MED ORDER — LISINOPRIL 40 MG PO TABS: 40.0000 mg | ORAL_TABLET | Freq: Every day | ORAL | Status: DC

## 2015-04-17 NOTE — Assessment & Plan Note (Signed)
This was moderate by echo. Continue to monitor. He has no murmurs by exam.

## 2015-04-17 NOTE — Assessment & Plan Note (Signed)
Blood pressure is controlled on current medications. However, in order to simplify his medications, I stopped amlodipine and increased lisinopril to 40 mg once daily.

## 2015-04-17 NOTE — Progress Notes (Signed)
Primary care physician: Dr. Ronnald Collum  HPI  This is a pleasant 64 year old male who is here today for a follow-up visit regarding coronary artery disease.  He has known history of one-vessel coronary artery disease by cardiac catheterization in 2012 at [facility], hypertension, type 2 diabetes, hyperlipidemia, hypothyroidism, hypogonadism and tobacco use. Cardiac catheterization at [facility] in 2012 showed mild 20% left main stenosis, 20% diffuse LAD disease, 20% left circumflex stenosis and subtotally occluded proximal right coronary artery with left-to-right collaterals. Ejection fraction was normal. He was treated medically. He was hospitalized at [facility] in March with shortness of breath and chest tightness. He ruled out for myocardial infarction. EKG showed no acute changes. Echocardiogram showed normal LV systolic function, mild left ventricular hypertrophy, moderate mitral regurgitation and aortic valve sclerosis without stenosis. He underwent a treadmill nuclear stress test which showed no evidence of ischemia with normal ejection fraction. His blood pressure was elevated. Lisinopril and amlodipine were both added. He has been doing reasonably well and resumed work. He feels stressed at work and is thinking about retirement. He has not had any recurrent chest pain. Blood pressure has been controlled with readings below 161 systolic.  Allergies  Allergen Reactions  . Morphine Rash     Current Outpatient Prescriptions on File Prior to Visit  Medication Sig Dispense Refill  . ALPRAZolam (XANAX) 0.5 MG tablet Take 0.5 mg by mouth at bedtime as needed for anxiety.    Marland Kitchen amLODipine (NORVASC) 5 MG tablet Take 5 mg by mouth daily.    . cyanocobalamin (,VITAMIN B-12,) 1000 MCG/ML injection Inject 1,000 mcg into the muscle once.    Marland Kitchen levothyroxine (SYNTHROID, LEVOTHROID) 200 MCG tablet Take 200 mcg by mouth daily before breakfast.    . lisinopril (PRINIVIL,ZESTRIL) 10 MG tablet Take 10 mg by mouth daily.    .  metoprolol tartrate (LOPRESSOR) 25 MG tablet Take 25 mg by mouth 2 (two) times daily.    . nitroGLYCERIN (NITROSTAT) 0.4 MG SL tablet Place 1 tablet (0.4 mg total) under the tongue every 5 (five) minutes as needed for chest pain. 25 tablet 3  . pravastatin (PRAVACHOL) 10 MG tablet Take 10 mg by mouth daily.    . Testosterone Cypionate 200 MG/ML KIT Inject into the muscle every 3 (three) months.    . Vitamin D, Ergocalciferol, (DRISDOL) 50000 UNITS CAPS capsule Take 50,000 Units by mouth every 7 (seven) days.    Marland Kitchen zolpidem (AMBIEN) 10 MG tablet Take 10 mg by mouth at bedtime.     No current facility-administered medications on file prior to visit.     Past Medical History  Diagnosis Date  . HTN (hypertension)   . Tobacco abuse   . DM2 (diabetes mellitus, type 2)   . Hypogonadism in male   . Hypothyroidism   . Mitral regurgitation     a. echo 02/2015: EF 55-60%, mild LVH, moderate MR, mild aortic sclerosis without stenosis, mild TR  . Pernicious anemia   . Hypogonadism male   . CAD (coronary artery disease)     a. cath 10/20/2011: LM 20%, LAD diffuse 20%, LCx diffuse 20%, sub totally occluded pRCA 90% extending to mRCA 99% w/ left to right collats, med Rx recommeded, EF 87%.   Marland Kitchen HLD (hyperlipidemia)      Past Surgical History  Procedure Laterality Date  . Cholecystectomy    . Cardiac catheterization      [facility]  . Cardiac catheterization      [facility]  . Hand tendon surgery  Family History  Problem Relation Age of Onset  . Family history unknown: Yes     History   Social History  . Marital Status: Married    Spouse Name: N/A  . Number of Children: N/A  . Years of Education: N/A   Occupational History  . Not on file.   Social History Main Topics  . Smoking status: Current Every Day Smoker -- 0.25 packs/day for 40 years    Types: Cigarettes  . Smokeless tobacco: Not on file  . Alcohol Use: No  . Drug Use: No  . Sexual Activity: Not on file   Other Topics  Concern  . Not on file   Social History Narrative       PHYSICAL EXAM   BP 140/68 mmHg  Pulse 88  Ht 6' 2"  (1.88 m)  Wt 201 lb 12 oz (91.513 kg)  BMI 25.89 kg/m2 Constitutional: He is oriented to person, place, and time. He appears well-developed and well-nourished. No distress.  HENT: No nasal discharge.  Head: Normocephalic and atraumatic.  Eyes: Pupils are equal and round.  No discharge. Neck: Normal range of motion. Neck supple. No JVD present. No thyromegaly present.  Cardiovascular: Normal rate, regular rhythm, normal heart sounds. Exam reveals no gallop and no friction rub. No murmur heard.  Pulmonary/Chest: Effort normal and breath sounds normal. No stridor. No respiratory distress. He has no wheezes. He has no rales. He exhibits no tenderness.  Abdominal: Soft. Bowel sounds are normal. He exhibits no distension. There is no tenderness. There is no rebound and no guarding.  Musculoskeletal: Normal range of motion. He exhibits no edema and no tenderness.  Neurological: He is alert and oriented to person, place, and time. Coordination normal.  Skin: Skin is warm and dry. No rash noted. He is not diaphoretic. No erythema. No pallor.  Psychiatric: He has a normal mood and affect. His behavior is normal. Judgment and thought content normal.       ASSESSMENT AND PLAN

## 2015-04-17 NOTE — Assessment & Plan Note (Signed)
He is currently on pravastatin. Recent lipid profile in March showed an HDL of 37 and an LDL of 68 with normal triglycerides.

## 2015-04-17 NOTE — Patient Instructions (Signed)
Medication Instructions:  Your physician has recommended you make the following change in your medication:  1) STOP Amlodipine 2) INCREASE Lisinopril to 40mg  daily. An Rx has been sent to your pharmacy   Labwork: None   Testing/Procedures: Your physician has requested that you have an abdominal aorta duplex. During this test, an ultrasound is used to evaluate the aorta. Allow 30 minutes for this exam. Do not eat after midnight the day before and avoid carbonated beverages  Follow-Up: Your physician wants you to follow-up in: 6 months with Dr.Arida You will receive a reminder letter in the mail two months in advance. If you don't receive a letter, please call our office to schedule the follow-up appointment.  Any Other Special Instructions Will Be Listed Below (If Applicable).

## 2015-04-17 NOTE — Assessment & Plan Note (Signed)
He is doing well overall with no symptoms suggestive of angina. Continue medical therapy. 

## 2015-04-17 NOTE — Assessment & Plan Note (Signed)
I discussed with him the importance of smoking cessation but he is not able to quit at the present time due to stress. Given his age and smoking status, I requested an abdominal aortic ultrasound for one-time screening of abdominal aortic aneurysm.

## 2015-05-06 ENCOUNTER — Ambulatory Visit (INDEPENDENT_AMBULATORY_CARE_PROVIDER_SITE_OTHER): Payer: BLUE CROSS/BLUE SHIELD

## 2015-05-06 DIAGNOSIS — I714 Abdominal aortic aneurysm, without rupture, unspecified: Secondary | ICD-10-CM

## 2015-11-07 ENCOUNTER — Other Ambulatory Visit: Payer: Self-pay

## 2015-11-07 ENCOUNTER — Telehealth: Payer: Self-pay

## 2015-11-07 MED ORDER — LISINOPRIL 40 MG PO TABS: 40.0000 mg | ORAL_TABLET | Freq: Every day | ORAL | Status: DC

## 2015-11-07 NOTE — Telephone Encounter (Signed)
Refill sent for Lisinopril 40 mg  

## 2015-11-07 NOTE — Telephone Encounter (Signed)
S/w pt who states he is out of lisinopril.  Prescription was refilled today. Advised pt to check w/pharmacy again. Confirmed 01/05/16 appt.

## 2015-11-07 NOTE — Telephone Encounter (Signed)
Pt needs to talk about his BP medication

## 2016-01-05 ENCOUNTER — Encounter: Payer: Self-pay | Admitting: Cardiovascular Disease

## 2016-01-05 ENCOUNTER — Ambulatory Visit (INDEPENDENT_AMBULATORY_CARE_PROVIDER_SITE_OTHER): Payer: Medicare Other | Admitting: Cardiovascular Disease

## 2016-01-05 VITALS — BP 128/64 | HR 74 | Ht 74.0 in | Wt 213.5 lb

## 2016-01-05 DIAGNOSIS — I7300 Raynaud's syndrome without gangrene: Secondary | ICD-10-CM

## 2016-01-05 DIAGNOSIS — R0989 Other specified symptoms and signs involving the circulatory and respiratory systems: Secondary | ICD-10-CM

## 2016-01-05 DIAGNOSIS — I10 Essential (primary) hypertension: Secondary | ICD-10-CM | POA: Diagnosis not present

## 2016-01-05 DIAGNOSIS — I25118 Atherosclerotic heart disease of native coronary artery with other forms of angina pectoris: Secondary | ICD-10-CM | POA: Diagnosis not present

## 2016-01-05 DIAGNOSIS — E785 Hyperlipidemia, unspecified: Secondary | ICD-10-CM

## 2016-01-05 MED ORDER — AMLODIPINE BESYLATE 5 MG PO TABS: 5.0000 mg | ORAL_TABLET | Freq: Every day | ORAL | Status: DC

## 2016-01-05 MED ORDER — PRAVASTATIN SODIUM 20 MG PO TABS: 20.0000 mg | ORAL_TABLET | Freq: Every evening | ORAL | Status: DC

## 2016-01-05 NOTE — Progress Notes (Signed)
Primary care physician: Dr. Ronnald Collum  HPI  This is a pleasant 65 year old male who is here today for a follow-up visit regarding coronary artery disease.  He has known history of one-vessel coronary artery disease by cardiac catheterization in 2012 at [facility], hypertension, type 2 diabetes, hyperlipidemia, hypothyroidism, hypogonadism and tobacco use. Cardiac catheterization at [facility] in 2012 showed mild 20% left main stenosis, 20% diffuse LAD disease, 20% left circumflex stenosis and subtotally occluded proximal right coronary artery with left-to-right collaterals. Ejection fraction was normal. He was treated medically. He was hospitalized at [facility] in March, 2016 with shortness of breath and chest tightness. He ruled out for myocardial infarction. EKG showed no acute changes. Echocardiogram showed normal LV systolic function, mild left ventricular hypertrophy, moderate mitral regurgitation and aortic valve sclerosis without stenosis. He underwent a treadmill nuclear stress test which showed no evidence of ischemia with normal ejection fraction. His blood pressure was elevated. Lisinopril and amlodipine were both added.  During last visit, I discontinued amlodipine and increase the dose of lisinopril to 40 mg once daily. A screening abdominal ultrasound showed no evidence of aortic aneurysm.   he continues to do well and denies any chest pain or shortness of breath. He walks 20 miles per week for exercise. His main issue today it is hand sensitivity to cold which usually causes them to turn pale , red and occasionally purple. This is usually associated with numbness.  He reports that he had these symptoms for at least one year.  Allergies  Allergen Reactions  . Morphine Rash     Current Outpatient Prescriptions on File Prior to Visit  Medication Sig Dispense Refill  . ALPRAZolam (XANAX) 0.5 MG tablet Take 0.5 mg by mouth at bedtime as needed for anxiety.    . cyanocobalamin (,VITAMIN B-12,) 1000  MCG/ML injection Inject 1,000 mcg into the muscle once.    Marland Kitchen levothyroxine (SYNTHROID, LEVOTHROID) 200 MCG tablet Take 200 mcg by mouth daily before breakfast.    . metoprolol tartrate (LOPRESSOR) 25 MG tablet Take 25 mg by mouth 2 (two) times daily.    . nitroGLYCERIN (NITROSTAT) 0.4 MG SL tablet Place 1 tablet (0.4 mg total) under the tongue every 5 (five) minutes as needed for chest pain. 25 tablet 3  . Testosterone Cypionate 200 MG/ML KIT Inject into the muscle every 21 ( twenty-one) days.     . Vitamin D, Ergocalciferol, (DRISDOL) 50000 UNITS CAPS capsule Take 50,000 Units by mouth every 7 (seven) days.    Marland Kitchen zolpidem (AMBIEN) 10 MG tablet Take 10 mg by mouth at bedtime.     No current facility-administered medications on file prior to visit.     Past Medical History  Diagnosis Date  . HTN (hypertension)   . Tobacco abuse   . DM2 (diabetes mellitus, type 2) ([place])   . Hypogonadism in male   . Hypothyroidism   . Mitral regurgitation     a. echo 02/2015: EF 55-60%, mild LVH, moderate MR, mild aortic sclerosis without stenosis, mild TR  . Pernicious anemia   . Hypogonadism male   . CAD (coronary artery disease)     a. cath 10/20/2011: LM 20%, LAD diffuse 20%, LCx diffuse 20%, sub totally occluded pRCA 90% extending to mRCA 99% w/ left to right collats, med Rx recommeded, EF 87%.   Marland Kitchen HLD (hyperlipidemia)      Past Surgical History  Procedure Laterality Date  . Cholecystectomy    . Cardiac catheterization      [facility]  .  Cardiac [facility]      [facility]  . Hand tendon surgery       Family History  Problem Relation Age of Onset  . Family history unknown: Yes     Social History   Social History  . Marital Status: Married    Spouse Name: N/A  . Number of Children: N/A  . Years of Education: N/A   Occupational History  . Not on file.   Social History Main Topics  . Smoking status: Current Every Day Smoker -- 0.25 packs/day for 40 years    Types: Cigarettes  .  Smokeless tobacco: Not on file  . Alcohol Use: No  . Drug Use: No  . Sexual Activity: Not on file   Other Topics Concern  . Not on file   Social History Narrative       PHYSICAL EXAM   BP 128/64 mmHg  Pulse 74  Ht _0  (1.88 m)  Wt 213 lb 8 oz (96.843 kg)  BMI 27.40 kg/m2 Constitutional: He is oriented to person, place, and time. He appears well-developed and well-nourished. No distress.  HENT: No nasal discharge.  Head: Normocephalic and atraumatic.  Eyes: Pupils are equal and round.  No discharge. Neck: Normal range of motion. Neck supple. No JVD present. No thyromegaly present.  Right carotid bruit. Cardiovascular: Normal rate, regular rhythm, normal heart sounds. Exam reveals no gallop and no friction rub. No murmur heard.  Pulmonary/Chest: Effort normal and breath sounds normal. No stridor. No respiratory distress. He has no wheezes. He has no rales. He exhibits no tenderness.  Abdominal: Soft. Bowel sounds are normal. He exhibits no distension. There is no tenderness. There is no rebound and no guarding.  Musculoskeletal: Normal range of motion. He exhibits no edema and no tenderness.  Neurological: He is alert and oriented to person, place, and time. Coordination normal.  Skin: Skin is warm and dry. No rash noted. He is not diaphoretic. No erythema. No pallor.  Psychiatric: He has a normal mood and affect. His behavior is normal. Judgment and thought content normal.   vascular: radial pulses and ulnar pulses are normal bilaterally.   EKG: normal sinus rhythm with no significant ST or T wave changes.   ASSESSMENT AND PLAN

## 2016-01-05 NOTE — Assessment & Plan Note (Signed)
He is doing very well with no symptoms suggestive of angina. Continue medical therapy. 

## 2016-01-05 NOTE — Assessment & Plan Note (Signed)
Due to  Raynaud's  Syndrome, I elected to switch lisinopril 2 amlodipine 5 mg once daily.

## 2016-01-05 NOTE — Patient Instructions (Signed)
Medication Instructions:  Your physician has recommended you make the following change in your medication:  STOP taking lisinopril START taking amlodipine  daily START taking pravastatin  daily   Labwork: none  Testing/Procedures: Your physician has requested that you have a carotid duplex. This test is an ultrasound of the carotid arteries in your neck. It looks at blood flow through these arteries that supply the brain with blood. Allow one hour for this exam. There are no restrictions or special instructions.    Follow-Up: Your physician recommends that you schedule a follow-up appointment in: 3 months with Dr. Kirke Corin.    Any Other Special Instructions Will Be Listed Below (If Applicable).     If you need a refill on your cardiac medications before your next appointment, please call your pharmacy.

## 2016-01-05 NOTE — Assessment & Plan Note (Signed)
I requested carotid Doppler. 

## 2016-01-05 NOTE — Assessment & Plan Note (Signed)
I refilled his pravastatin an increased the dose to 20 mg daily. Surprisingly, his lipid profile was actually well controlled on 10 mg daily.

## 2016-01-05 NOTE — Assessment & Plan Note (Signed)
From his description, it appears that he has this.His peripheral pulses are normal.   I strongly advised him to quit smoking.  I elected to switch fosinopril to amlodipine. If symptoms persist, I will consider switching metoprolol to carvedilol.

## 2016-01-08 ENCOUNTER — Other Ambulatory Visit: Payer: Self-pay | Admitting: Cardiovascular Disease

## 2016-01-08 DIAGNOSIS — R0989 Other specified symptoms and signs involving the circulatory and respiratory systems: Secondary | ICD-10-CM

## 2016-01-08 DIAGNOSIS — I25118 Atherosclerotic heart disease of native coronary artery with other forms of angina pectoris: Secondary | ICD-10-CM

## 2016-01-13 ENCOUNTER — Ambulatory Visit: Payer: Medicare Other

## 2016-01-13 DIAGNOSIS — R0989 Other specified symptoms and signs involving the circulatory and respiratory systems: Secondary | ICD-10-CM

## 2016-01-13 DIAGNOSIS — I25118 Atherosclerotic heart disease of native coronary artery with other forms of angina pectoris: Secondary | ICD-10-CM

## 2016-03-30 ENCOUNTER — Other Ambulatory Visit: Payer: Self-pay | Admitting: Cardiovascular Disease

## 2016-03-31 ENCOUNTER — Telehealth: Payer: Self-pay | Admitting: Cardiovascular Disease

## 2016-03-31 MED ORDER — METOPROLOL TARTRATE 25 MG PO TABS: 25.0000 mg | ORAL_TABLET | Freq: Two times a day (BID) | ORAL | Status: DC

## 2016-03-31 NOTE — Telephone Encounter (Signed)
Refill sent again for Metoprolol tart 25 mg one tablet twice a day #180 with 3 refills.

## 2016-03-31 NOTE — Telephone Encounter (Signed)
Refill sent for Metoprolol tart 25 mg #90 day supply.

## 2016-03-31 NOTE — Telephone Encounter (Signed)
Patient not sure if he is to continue this medication.

## 2016-03-31 NOTE — Addendum Note (Signed)
Addended by: Festus AloeRESPO, SHARON G on: 03/31/2016 11:54 AM   Modules accepted: Orders

## 2016-03-31 NOTE — Telephone Encounter (Signed)
*  STAT* If patient is at the pharmacy, call can be transferred to refill team.   1. Which medications need to be refilled? (please list name of each medication and dose if known)metoprolol tartrate (LOPRESSOR) 25 MG tablet   2. Which pharmacy/location (including street and city if local pharmacy) is medication to be sent to? [facility] [place]  3. Do they need a 30 day or 90 day supply? 90 day

## 2016-04-05 ENCOUNTER — Encounter: Payer: Self-pay | Admitting: Cardiovascular Disease

## 2016-04-05 ENCOUNTER — Ambulatory Visit (INDEPENDENT_AMBULATORY_CARE_PROVIDER_SITE_OTHER): Payer: Medicare Other | Admitting: Cardiovascular Disease

## 2016-04-05 VITALS — BP 130/64 | HR 78 | Ht 74.0 in | Wt 215.5 lb

## 2016-04-05 DIAGNOSIS — I10 Essential (primary) hypertension: Secondary | ICD-10-CM

## 2016-04-05 DIAGNOSIS — Z720 Tobacco use: Secondary | ICD-10-CM

## 2016-04-05 DIAGNOSIS — I25118 Atherosclerotic heart disease of native coronary artery with other forms of angina pectoris: Secondary | ICD-10-CM

## 2016-04-05 MED ORDER — VARENICLINE TARTRATE 0.5 MG PO TABS: 0.5000 mg | ORAL_TABLET | Freq: Two times a day (BID) | ORAL | Status: DC

## 2016-04-05 NOTE — Patient Instructions (Addendum)
Medication Instructions:  Your physician has recommended you make the following change in your medication:  START taking chantix 0.5mg  twice daily   Labwork: none  Testing/Procedures: none  Follow-Up: Your physician wants you to follow-up in: six months with Dr. Kirke Corin.  You will receive a reminder letter in the mail two months in advance. If you don't receive a letter, please call our office to schedule the follow-up appointment.   Any Other Special Instructions Will Be Listed Below (If Applicable).     If you need a refill on your cardiac medications before your next appointment, please call your pharmacy.  Smoking Cessation, Tips for Success If you are ready to quit smoking, congratulations! You have chosen to help yourself be healthier. Cigarettes bring nicotine, tar, carbon monoxide, and other irritants into your body. Your lungs, heart, and blood vessels will be able to work better without these poisons. There are many different ways to quit smoking. Nicotine gum, nicotine patches, a nicotine inhaler, or nicotine nasal spray can help with physical craving. Hypnosis, support groups, and medicines help break the habit of smoking. WHAT THINGS CAN I DO TO MAKE QUITTING EASIER?  Here are some tips to help you quit for good:  Pick a date when you will quit smoking completely. Tell all of your friends and family about your plan to quit on that date.  Do not try to slowly cut down on the number of cigarettes you are smoking. Pick a quit date and quit smoking completely starting on that day.  Throw away all cigarettes.   Clean and remove all ashtrays from your home, work, and car.  On a card, write down your reasons for quitting. Carry the card with you and read it when you get the urge to smoke.  Cleanse your body of nicotine. Drink enough water and fluids to keep your urine clear or pale yellow. Do this after quitting to flush the nicotine from your body.  Learn to predict your  moods. Do not let a bad situation be your excuse to have a cigarette. Some situations in your life might tempt you into wanting a cigarette.  Never have "just one" cigarette. It leads to wanting another and another. Remind yourself of your decision to quit.  Change habits associated with smoking. If you smoked while driving or when feeling stressed, try other activities to replace smoking. Stand up when drinking your coffee. Brush your teeth after eating. Sit in a different chair when you read the paper. Avoid alcohol while trying to quit, and try to drink fewer caffeinated beverages. Alcohol and caffeine may urge you to smoke.  Avoid foods and drinks that can trigger a desire to smoke, such as sugary or spicy foods and alcohol.  Ask people who smoke not to smoke around you.  Have something planned to do right after eating or having a cup of coffee. For example, plan to take a walk or exercise.  Try a relaxation exercise to calm you down and decrease your stress. Remember, you may be tense and nervous for the first 2 weeks after you quit, but this will pass.  Find new activities to keep your hands busy. Play with a pen, coin, or rubber band. Doodle or draw things on paper.  Brush your teeth right after eating. This will help cut down on the craving for the taste of tobacco after meals. You can also try mouthwash.   Use oral substitutes in place of cigarettes. Try using lemon drops, carrots,  cinnamon sticks, or chewing gum. Keep them handy so they are available when you have the urge to smoke.  When you have the urge to smoke, try deep breathing.  Designate your home as a nonsmoking area.  If you are a heavy smoker, ask your health care provider about a prescription for nicotine chewing gum. It can ease your withdrawal from nicotine.  Reward yourself. Set aside the cigarette money you save and buy yourself something nice.  Look for support from others. Join a support group or smoking  cessation program. Ask someone at home or at work to help you with your plan to quit smoking.  Always ask yourself, "Do I need this cigarette or is this just a reflex?" Tell yourself, "Today, I choose not to smoke," or "I do not want to smoke." You are reminding yourself of your decision to quit.  Do not replace cigarette smoking with electronic cigarettes (commonly called e-cigarettes). The safety of e-cigarettes is unknown, and some may contain harmful chemicals.  If you relapse, do not give up! Plan ahead and think about what you will do the next time you get the urge to smoke. HOW WILL I FEEL WHEN I QUIT SMOKING? You may have symptoms of withdrawal because your body is used to nicotine (the addictive substance in cigarettes). You may crave cigarettes, be irritable, feel very hungry, cough often, get headaches, or have difficulty concentrating. The withdrawal symptoms are only temporary. They are strongest when you first quit but will go away within 10-14 days. When withdrawal symptoms occur, stay in control. Think about your reasons for quitting. Remind yourself that these are signs that your body is healing and getting used to being without cigarettes. Remember that withdrawal symptoms are easier to treat than the major diseases that smoking can cause.  Even after the withdrawal is over, expect periodic urges to smoke. However, these cravings are generally short lived and will go away whether you smoke or not. Do not smoke! WHAT RESOURCES ARE AVAILABLE TO HELP ME QUIT SMOKING? Your health care provider can direct you to community resources or hospitals for support, which may include:  Group support.  Education.  Hypnosis.  Therapy.   This information is not intended to replace advice given to you by your health care provider. Make sure you discuss any questions you have with your health care provider.   Document Released: 08/20/2004 Document Revised: 12/13/2014 Document Reviewed:  05/10/2013 Elsevier Interactive Patient Education [employer]2016 Elsevier Inc.

## 2016-04-05 NOTE — Progress Notes (Signed)
Cardiology Office Note   Date:  04/05/2016   ID:  Tyrone CheeksWayne H Adamek, DOB 1951/01/31, MRN [id number]  PCP:  Alan MulderMORAYATI,SHAMIL J, MD  Cardiologist:   Lorine BearsMuhammad Jalisa Sacco, MD   Chief Complaint  Patient presents with  . other    3 month f/u no compliants today. Meds reviewed verbally.      History of Present Illness: Tyrone Blankenship is a 65 y.o. male who presents for a follow-up visit regarding coronary artery disease.  He has known history of one-vessel coronary artery disease [facility], hypertension, type 2 diabetes, hyperlipidemia, hypothyroidism, hypogonadism and tobacco use. [facility] showed mild 20% left main stenosis, 20% diffuse LAD disease, 20% left circumflex stenosis and subtotally occluded proximal right coronary artery with left-to-right collaterals. Ejection fraction was normal. He was treated medically.  He was hospitalized at [facility] in March, 2016 with shortness of breath and chest tightness. He ruled out for myocardial infarction. EKG showed no acute changes. Echocardiogram showed normal LV systolic function, mild left ventricular hypertrophy, moderate mitral regurgitation and aortic valve sclerosis without stenosis. He underwent a treadmill nuclear stress test which showed no evidence of ischemia with normal ejection fraction.  Prior abdominal aortic ultrasound in 2016 showed no evidence of aortic aneurysm. Carotid Doppler in 2017 showed mild nonobstructive atherosclerosis.  During last visit, he was switched from lisinopril to amlodipine due to suspected Raynaud's phenomenon affecting his hands. Overall he has been doing well and denies any chest pain or significant dyspnea. He continues to be very active with no exertional symptoms. Hand symptoms are unchanged. He wants to quit smoking.   Past Medical History  Diagnosis Date  . HTN (hypertension)   . Tobacco abuse   . DM2 (diabetes mellitus, type 2) ([facility])   .  Hypogonadism in male   . Hypothyroidism   . Mitral regurgitation     a. echo 02/2015: EF 55-60%, mild LVH, moderate MR, mild aortic sclerosis without stenosis, mild TR  . Pernicious anemia   . Hypogonadism male   . CAD (coronary artery disease)     a. cath 10/20/2011: LM 20%, LAD diffuse 20%, LCx diffuse 20%, sub totally occluded pRCA 90% extending to mRCA 99% w/ left to right collats, med Rx recommeded, EF 87%.   Marland Kitchen. HLD (hyperlipidemia)     Past Surgical History  Procedure Laterality Date  . Cholecystectomy    . Cardiac catheterization      [facility]  . Cardiac catheterization      [facility]  . Hand tendon surgery       Current Outpatient Prescriptions  Medication Sig Dispense Refill  . ALPRAZolam (XANAX) 0.5 MG tablet Take 0.5 mg by mouth at bedtime as needed for anxiety.    Marland Kitchen. amLODipine (NORVASC) 5 MG tablet Take 1 tablet (5 mg total) by mouth daily. 30 tablet 3  . cyanocobalamin (,VITAMIN B-12,) 1000 MCG/ML injection Inject 1,000 mcg into the muscle once.    Marland Kitchen. levothyroxine (SYNTHROID, LEVOTHROID) 200 MCG tablet Take 200 mcg by mouth daily before breakfast.    . metoprolol tartrate (LOPRESSOR) 25 MG tablet Take 1 tablet (25 mg total) by mouth 2 (two) times daily. 180 tablet 3  . nitroGLYCERIN (NITROSTAT) 0.4 MG SL tablet Place 1 tablet (0.4 mg total) under the tongue every 5 (five) minutes as needed for chest pain. 25 tablet 3  . pravastatin (PRAVACHOL) 20 MG tablet Take 1 tablet (20 mg total) by mouth every evening.  30 tablet 3  . Vitamin D, Ergocalciferol, (DRISDOL) 50000 UNITS CAPS capsule Take 50,000 Units by mouth every 7 (seven) days.    Marland Kitchen zolpidem (AMBIEN) 10 MG tablet Take 10 mg by mouth at bedtime.     No current facility-administered medications for this visit.    Allergies:   Morphine    Social History:  The patient  reports that he has been smoking Cigarettes.  He has a 10 pack-year smoking history. He does not have any smokeless tobacco history on file. He reports that he  does not drink alcohol or use illicit drugs.   Family History:  The patient's Family history is unknown by patient.    ROS:  Please see the history of present illness.   Otherwise, review of systems are positive for none.   All other systems are reviewed and negative.    PHYSICAL EXAM: VS:  BP 130/64 mmHg  Pulse 78  Ht  (1.88 m)  Wt 215 lb 8 oz (97.75 kg)  BMI 27.66 kg/m2 , BMI Body mass index is 27.66 kg/(m^2). GEN: Well nourished, well developed, in no acute distress HEENT: normal Neck: no JVD, carotid bruits, or masses Cardiac: RRR; no murmurs, rubs, or gallops,no edema  Respiratory:  clear to auscultation bilaterally, normal work of breathing GI: soft, nontender, nondistended, + BS MS: no deformity or atrophy Skin: warm and dry, no rash Neuro:  Strength and sensation are intact Psych: euthymic mood, full affect   EKG:  EKG is not ordered today.    Recent Labs: No results found for requested labs within last 365 days.    Lipid Panel    Component Value Date/Time   CHOL 119 03/06/2015 0708   TRIG 72 03/06/2015 0708   HDL 37* 03/06/2015 0708   VLDL 14 03/06/2015 0708   LDLCALC 68 03/06/2015 0708      Wt Readings from Last 3 Encounters:  04/05/16 215 lb 8 oz (97.75 kg)  01/05/16 213 lb 8 oz (96.843 kg)  04/17/15 201 lb 12 oz (91.513 kg)        ASSESSMENT AND PLAN:  1.  Coronary artery disease involving native coronary arteries without angina: He is overall doing very well with no anginal symptoms. Continue medical therapy.  2. Hyperlipidemia: Continue treatment with pravastatin. Most recent LDL was 68.  3. Essential hypertension: Blood pressure is well controlled on current medications.  4. Raynaud's phenomenon : Slightly improved with amlodipine. Smoking cessation is strongly advised.  5. Tobacco use: He is interested in smoking cessation but needs some help. I prescribed Chantix.    Disposition:   FU with me in 6 months  Signed,  Lorine Bears, MD  04/05/2016 2:09 PM    [facility]

## 2016-04-06 ENCOUNTER — Other Ambulatory Visit: Payer: Self-pay

## 2016-04-06 ENCOUNTER — Telehealth: Payer: Self-pay | Admitting: Cardiovascular Disease

## 2016-04-06 MED ORDER — VARENICLINE TARTRATE 0.5 MG PO TABS: 0.5000 mg | ORAL_TABLET | ORAL | Status: DC

## 2016-04-06 MED ORDER — VARENICLINE TARTRATE 1 MG PO TABS: 1.0000 mg | ORAL_TABLET | Freq: Two times a day (BID) | ORAL | Status: DC

## 2016-04-06 NOTE — Telephone Encounter (Signed)
Per Dr. Kirke CorinArida: "When I prescribe Chantix, I gave them the start pack + 2 continuation packs. The dosing is very specific and it's usually detailed in the start pack ( 0.5 mg once daily for 3 days then 0.5 mg twice daily for 4 days followed by 1 mg twice daily after)."  Notified pharmacy of new prescription submitted. Reviewed instructions w/pt who verbalized understanding and will pick up chantix today when ready. Advised pt to call back if any questions or concerns. He is appreciative of the call and had no questions.

## 2016-04-13 ENCOUNTER — Other Ambulatory Visit: Payer: Self-pay

## 2016-04-13 MED ORDER — PRAVASTATIN SODIUM 20 MG PO TABS: 20.0000 mg | ORAL_TABLET | Freq: Every evening | ORAL | Status: DC

## 2016-04-13 MED ORDER — AMLODIPINE BESYLATE 5 MG PO TABS: 5.0000 mg | ORAL_TABLET | Freq: Every day | ORAL | Status: DC

## 2016-04-13 NOTE — Telephone Encounter (Signed)
Refill sent for pravastatin. 

## 2016-04-13 NOTE — Telephone Encounter (Signed)
Refill sent for amlodipine 5 mg  

## 2016-04-19 ENCOUNTER — Other Ambulatory Visit: Payer: Self-pay | Admitting: *Deleted

## 2016-04-19 MED ORDER — AMLODIPINE BESYLATE 5 MG PO TABS: 5.0000 mg | ORAL_TABLET | Freq: Every day | ORAL | Status: DC

## 2016-04-19 MED ORDER — PRAVASTATIN SODIUM 20 MG PO TABS: 20.0000 mg | ORAL_TABLET | Freq: Every evening | ORAL | Status: DC

## 2016-06-09 IMAGING — NM MYOCARDIAL PERFUSION
1 series · 1 of 1 positions shown · non-contrast
Comparison: none

[Series 1000: save_screens · 1 of 1 slices shown]
[im 1/1]
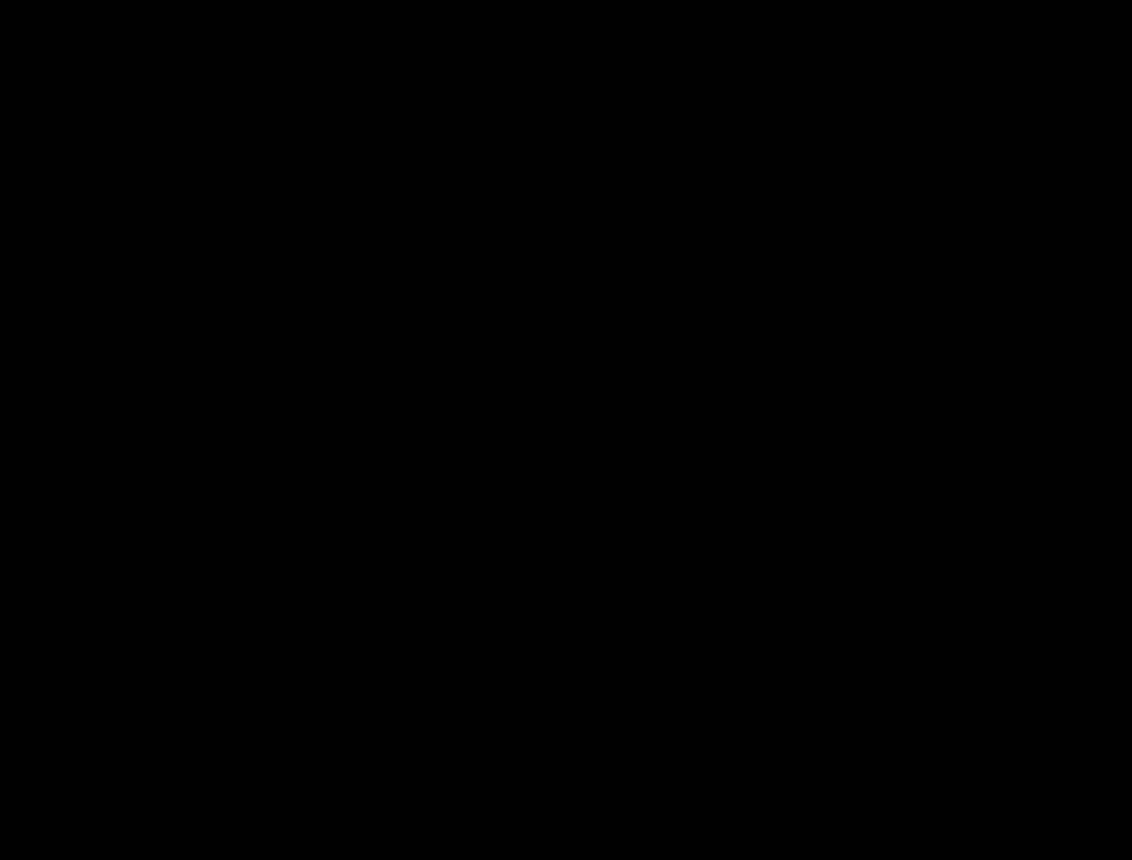

[1 of 1 positions shown; findings below may reference images not displayed]

Canned report from images found in remote index.

Refer to host system for actual result text.

## 2016-09-09 ENCOUNTER — Other Ambulatory Visit: Payer: Self-pay | Admitting: *Deleted

## 2016-09-09 MED ORDER — AMLODIPINE BESYLATE 5 MG PO TABS: 5.0000 mg | ORAL_TABLET | Freq: Every day | ORAL | 3 refills | Status: DC

## 2016-09-09 MED ORDER — PRAVASTATIN SODIUM 20 MG PO TABS: 20.0000 mg | ORAL_TABLET | Freq: Every evening | ORAL | 3 refills | Status: DC

## 2016-09-09 MED ORDER — METOPROLOL TARTRATE 25 MG PO TABS: 25.0000 mg | ORAL_TABLET | Freq: Two times a day (BID) | ORAL | 3 refills | Status: DC

## 2016-09-13 ENCOUNTER — Other Ambulatory Visit: Payer: Self-pay

## 2016-09-13 MED ORDER — METOPROLOL TARTRATE 25 MG PO TABS: 25.0000 mg | ORAL_TABLET | Freq: Two times a day (BID) | ORAL | 3 refills | Status: DC

## 2016-09-13 MED ORDER — AMLODIPINE BESYLATE 5 MG PO TABS: 5.0000 mg | ORAL_TABLET | Freq: Every day | ORAL | 3 refills | Status: DC

## 2016-09-13 MED ORDER — PRAVASTATIN SODIUM 20 MG PO TABS: 20.0000 mg | ORAL_TABLET | Freq: Every evening | ORAL | 3 refills | Status: DC

## 2016-10-18 ENCOUNTER — Encounter: Payer: Self-pay | Admitting: Cardiovascular Disease

## 2016-10-18 ENCOUNTER — Ambulatory Visit (INDEPENDENT_AMBULATORY_CARE_PROVIDER_SITE_OTHER): Payer: Medicare Other | Admitting: Cardiovascular Disease

## 2016-10-18 VITALS — BP 130/72 | HR 83 | Ht 74.0 in | Wt 226.0 lb

## 2016-10-18 DIAGNOSIS — I10 Essential (primary) hypertension: Secondary | ICD-10-CM

## 2016-10-18 DIAGNOSIS — I209 Angina pectoris, unspecified: Secondary | ICD-10-CM | POA: Diagnosis not present

## 2016-10-18 DIAGNOSIS — Z720 Tobacco use: Secondary | ICD-10-CM

## 2016-10-18 DIAGNOSIS — I25118 Atherosclerotic heart disease of native coronary artery with other forms of angina pectoris: Secondary | ICD-10-CM

## 2016-10-18 DIAGNOSIS — E7800 Pure hypercholesterolemia, unspecified: Secondary | ICD-10-CM

## 2016-10-18 NOTE — Patient Instructions (Signed)
Medication Instructions: Continue same medications.   Labwork: None.   Procedures/Testing: None.   Follow-Up: 1 year with Dr. Debara Kamphuis.   Any Additional Special Instructions Will Be Listed Below (If Applicable).     If you need a refill on your cardiac medications before your next appointment, please call your pharmacy.   

## 2016-10-18 NOTE — Progress Notes (Signed)
Cardiology Office Note   Date:  10/18/2016   ID:  Tyrone Blankenship, DOB [birth year], MRN [id number]  PCP:  Alan MulderMORAYATI,SHAMIL J, MD  Cardiologist:   Lorine BearsMuhammad Kindle Strohmeier, MD   Chief Complaint  Patient presents with  . other    6 month follow up. Meds reviewed by the pt. verbally. Pt. c/o chest pain at times.       History of Present Illness: Tyrone Blankenship is a 65 y.o. male who presents for a follow-up visit regarding coronary artery disease.  He has known history of one-vessel coronary artery disease by cardiac catheterization in 2012 at [facility], hypertension, type 2 diabetes, hyperlipidemia, hypothyroidism, hypogonadism, Raynaud's, and tobacco use. Cardiac catheterization at [facility] in 2012 showed mild 20% left main stenosis, 20% diffuse LAD disease, 20% left circumflex stenosis and subtotally occluded proximal right coronary artery with left-to-right collaterals. Ejection fraction was normal. He was treated medically.  He was hospitalized at [facility] in March, 2016 with shortness of breath and chest tightness. He ruled out for myocardial infarction. EKG showed no acute changes. Echocardiogram showed normal LV systolic function, mild left ventricular hypertrophy, moderate mitral regurgitation and aortic valve sclerosis without stenosis. He underwent a treadmill nuclear stress test which showed no evidence of ischemia with normal ejection fraction.  Prior abdominal aortic ultrasound in 2016 showed no evidence of aortic aneurysm. Carotid Doppler in 2017 showed mild nonobstructive atherosclerosis.  He has been doing well overall and reports rare episodes of chest discomfort. Shortness of breath is stable. Chest pain is usually triggered by stress and anxiety. His biggest issue seems to be generalized fatigue which did not improve with testosterone injections. I prescribed him Chantix during last visit. He did not quit completely but he is down to 2 cigarettes a day.   Past Medical History:  Diagnosis  Date  . CAD (coronary artery disease)    a. cath 10/20/2011: LM 20%, LAD diffuse 20%, LCx diffuse 20%, sub totally occluded pRCA 90% extending to mRCA 99% w/ left to right collats, med Rx recommeded, EF 87%.   Marland Kitchen. DM2 (diabetes mellitus, type 2) (HCC)   . HLD (hyperlipidemia)   . HTN (hypertension)   . Hypogonadism in male   . Hypogonadism male   . Hypothyroidism   . Mitral regurgitation    a. echo 02/2015: EF 55-60%, mild LVH, moderate MR, mild aortic sclerosis without stenosis, mild TR  . Pernicious anemia   . Tobacco abuse     Past Surgical History:  Procedure Laterality Date  . CARD[facility]AC CATHETERIZATION     [facility]  . CARD[facility] CAT[facility]TERIZATION     [facility]  . CHOLECYSTECTOMY    . HAND TENDON SURGERY       Current Outpatient Prescriptions  Medication Sig Dispense Refill  . ALPRAZolam (XANAX) 0.5 MG tablet Take 0.5 mg by mouth at bedtime as needed for anxiety.    Marland Kitchen. amLODipine (NORVASC) 5 MG tablet Take 1 tablet (5 mg total) by mouth daily. 90 tablet 3  . cyanocobalamin (,VITAMIN B-12,) 1000 MCG/ML injection Inject 1,000 mcg into the muscle once.    Marland Kitchen. levothyroxine (SYNTHROID, LEVOTHROID) 200 MCG tablet Take 200 mcg by mouth daily before breakfast.    . metoprolol tartrate (LOPRESSOR) 25 MG tablet Take 1 tablet (25 mg total) by mouth 2 (two) times daily. 180 tablet 3  . nitroGLYCERIN (NITROSTAT) 0.4 MG SL tablet Place 1 tablet (0.4 mg total) under the tongue every 5 (five) minutes as needed for chest pain. 25 tablet 3  .  pravastatin (PRAVACHOL) 20 MG tablet Take 1 tablet (20 mg total) by mouth every evening. 90 tablet 3  . Vitamin D, Ergocalciferol, (DRISDOL) 50000 UNITS CAPS capsule Take 50,000 Units by mouth every 7 (seven) days.    Marland Kitchen. zolpidem (AMBIEN) 10 MG tablet Take 10 mg by mouth at bedtime.     No current facility-administered medications for this visit.     Allergies:   Morphine    Social History:  The patient  reports that he has been smoking Cigarettes.  He has a 10.00  pack-year smoking history. He has never used smokeless tobacco. He reports that he does not drink alcohol or use drugs.   Family History:  The patient's Family history is unknown by patient.    ROS:  Please see the history of present illness.   Otherwise, review of systems are positive for none.   All other systems are reviewed and negative.    PHYSICAL EXAM: VS:  BP 130/72 (BP Location: Left Arm, Patient Position: Sitting, Cuff Size: Normal)   Pulse 83   Ht 6\' 2"  (1.88 m)   Wt 226 lb (102.5 kg)   BMI 29.02 kg/m  , BMI Body mass index is 29.02 kg/m. GEN: Well nourished, well developed, in no acute distress HEENT: normal Neck: no JVD, carotid bruits, or masses Cardiac: RRR; no murmurs, rubs, or gallops,no edema  Respiratory:  clear to auscultation bilaterally, normal work of breathing GI: soft, nontender, nondistended, + BS MS: no deformity or atrophy Skin: warm and dry, no rash Neuro:  Strength and sensation are intact Psych: euthymic mood, full affect   EKG:  EKG is ordered today. EKG showed normal sinus rhythm with nonspecific ST changes.   Recent Labs: No results found for requested labs within last 8760 hours.    Lipid Panel    Component Value Date/Time   CHOL 119 03/06/2015 0708   TRIG 72 03/06/2015 0708   HDL 37 (L) 03/06/2015 0708   VLDL 14 03/06/2015 0708   LDLCALC 68 03/06/2015 0708      Wt Readings from Last 3 Encounters:  10/18/16 226 lb (102.5 kg)  04/05/16 215 lb 8 oz (97.8 kg)  01/05/16 213 lb 8 oz (96.8 kg)        ASSESSMENT AND PLAN:  1.  Coronary artery disease involving native coronary arteries without angina: He is overall doing very well with only rare episodes of chest pain. Continue medical therapy.  2. Hyperlipidemia: Continue treatment with pravastatin. Most recent LDL was 68.  3. Essential hypertension: Blood pressure is well controlled on current medications.  4. Raynaud's phenomenon :  Continue treatment with amlodipine. His  symptoms are stable but he does report recent worsening with the cold weather. I encouraged him to use gloves.  5. Tobacco use: He cut down on smoking cessation but did not quit completely.    Disposition:   FU with me in 12 months  Signed,  Lorine BearsMuhammad Shayon Trompeter, MD  10/18/2016 3:05 PM    [facility]

## 2017-08-01 ENCOUNTER — Other Ambulatory Visit: Payer: Self-pay | Admitting: Cardiovascular Disease

## 2017-11-19 ENCOUNTER — Other Ambulatory Visit: Payer: Self-pay | Admitting: Cardiovascular Disease

## 2017-12-01 ENCOUNTER — Other Ambulatory Visit: Payer: Self-pay | Admitting: Cardiovascular Disease

## 2017-12-02 ENCOUNTER — Telehealth: Payer: Self-pay | Admitting: Cardiovascular Disease

## 2017-12-02 NOTE — Telephone Encounter (Signed)
Patient was last seen 10/2016 No follow up appointment is currently made.   Please advise if you would like me to refill, Thanks !

## 2017-12-02 NOTE — Telephone Encounter (Signed)
Refill request for pravastatin received. Pt past due for 1 year f/u. Refilled medication x 1 month. Notified pt. Transferred to scheduling for appt.

## 2017-12-20 NOTE — Telephone Encounter (Signed)
Patient was last seen 10/2016 No follow up appointment is currently made.   Please advise if you would like me to refill, Thanks ! 

## 2017-12-22 ENCOUNTER — Other Ambulatory Visit: Payer: Self-pay | Admitting: *Deleted

## 2017-12-22 ENCOUNTER — Telehealth: Payer: Self-pay | Admitting: Cardiovascular Disease

## 2017-12-22 MED ORDER — PRAVASTATIN SODIUM 20 MG PO TABS: 20.0000 mg | ORAL_TABLET | Freq: Every evening | ORAL | 0 refills | Status: DC

## 2017-12-22 NOTE — Telephone Encounter (Signed)
Requested Prescriptions   Signed Prescriptions Disp Refills  . pravastatin (PRAVACHOL) 20 MG tablet 30 tablet 0    Sig: Take 1 tablet (20 mg total) by mouth every evening.    Authorizing Provider: ARIDA, MUHAMMAD A    Ordering User: Cyncere Ruhe C    

## 2017-12-22 NOTE — Telephone Encounter (Signed)
Requested Prescriptions   Signed Prescriptions Disp Refills  . pravastatin (PRAVACHOL) 20 MG tablet 30 tablet 0    Sig: Take 1 tablet (20 mg total) by mouth every evening.    Authorizing Provider: Lorine BearsARIDA, MUHAMMAD A    Ordering User: Kendrick FriesLOPEZ, MARINA C

## 2017-12-22 NOTE — Telephone Encounter (Signed)
°*  STAT* If patient is at the pharmacy, call can be transferred to refill team.   1. Which medications need to be refilled? (please list name of each medication and dose if known)  Pravastatin 20 mg po q evening   2. Which pharmacy/location (including street and city if local pharmacy) is medication to be sent to? [facility] [place]   3. Do they need a 30 day or 90 day supply? 30

## 2017-12-26 ENCOUNTER — Telehealth: Payer: Self-pay | Admitting: Cardiovascular Disease

## 2017-12-26 NOTE — Telephone Encounter (Signed)
°*  STAT* If patient is at the pharmacy, call can be transferred to refill team.   1. Which medications need to be refilled? (please list name of each medication and dose if known) amLODipine (NORVASC) 5 MG 1 tablet daily  2. Which pharmacy/location (including street and city if local pharmacy) is medication to be sent [facility] [place]   3. Do they need a 30 day or 90 day supply? 90 day

## 2017-12-27 ENCOUNTER — Other Ambulatory Visit: Payer: Self-pay | Admitting: *Deleted

## 2017-12-27 MED ORDER — AMLODIPINE BESYLATE 5 MG PO TABS: 5.0000 mg | ORAL_TABLET | Freq: Every day | ORAL | 0 refills | Status: DC

## 2017-12-27 NOTE — Telephone Encounter (Signed)
[facility]

## 2018-01-23 ENCOUNTER — Other Ambulatory Visit: Payer: Self-pay | Admitting: Cardiovascular Disease

## 2018-01-26 ENCOUNTER — Telehealth: Payer: Self-pay | Admitting: Cardiovascular Disease

## 2018-01-26 NOTE — Telephone Encounter (Signed)
Made in error

## 2018-01-31 ENCOUNTER — Telehealth: Payer: Self-pay | Admitting: Cardiovascular Disease

## 2018-01-31 ENCOUNTER — Encounter: Payer: Self-pay | Admitting: Cardiovascular Disease

## 2018-01-31 ENCOUNTER — Ambulatory Visit (INDEPENDENT_AMBULATORY_CARE_PROVIDER_SITE_OTHER): Payer: Medicare Other | Admitting: Cardiovascular Disease

## 2018-01-31 VITALS — BP 142/64 | HR 76 | Ht 74.0 in | Wt 239.0 lb

## 2018-01-31 DIAGNOSIS — Z720 Tobacco use: Secondary | ICD-10-CM | POA: Diagnosis not present

## 2018-01-31 DIAGNOSIS — I2510 Atherosclerotic heart disease of native coronary artery without angina pectoris: Secondary | ICD-10-CM | POA: Diagnosis not present

## 2018-01-31 DIAGNOSIS — E7800 Pure hypercholesterolemia, unspecified: Secondary | ICD-10-CM | POA: Diagnosis not present

## 2018-01-31 MED ORDER — ATORVASTATIN CALCIUM 40 MG PO TABS: 40.0000 mg | ORAL_TABLET | Freq: Every day | ORAL | 3 refills | Status: DC

## 2018-01-31 NOTE — Patient Instructions (Signed)
Medication Instructions:  Your physician has recommended you make the following change in your medication:  STOP taking pravastatin START taking atorvastatin 40mg  once daily   Labwork: [facility]. Nothing to eat or drink after midnight the evening before your labs.   Testing/Procedures: none  Follow-Up: Your physician wants you to follow-up in: 1 year with Dr. Kirke CorinArida.  You will receive a reminder letter in the mail two months in advance. If you don't receive a letter, please call our office to schedule the follow-up appointment.   Any Other Special Instructions Will Be Listed Below (If Applicable).     If you need a refill on your cardiac medications before your next appointment, please call your pharmacy.

## 2018-01-31 NOTE — Telephone Encounter (Signed)
°*  STAT* If patient is at the pharmacy, call can be transferred to refill team.   1. Which medications need to be refilled? (please list name of each medication and dose if known) atorvastatin 40 mg po q d   2. Which pharmacy/location (including street and city if local pharmacy) is medication to be sent to?  [facility] [place]   3. Do they need a 30 day or 90 day supply? 90    Patient is doesn't have any meds and will have to wait on mail order . . . [facility]

## 2018-01-31 NOTE — Progress Notes (Signed)
Cardiology Office Note   Date:  01/31/2018   ID:  Tyrone, Blankenship 12/05/1951, MRN [id number]  PCP:  Alan Mulder, MD  Cardiologist:   Lorine Bears, MD   Chief Complaint  Patient presents with  . OTHER    12 month f/u no complaints today. Meds reviewed verbally with pt.      History of Present Illness: Tyrone Blankenship is a 67 y.o. male who presents for a follow-up visit regarding coronary artery disease.  He has known history of one-vessel coronary artery disease, hypertension, type 2 diabetes, hyperlipidemia, hypothyroidism, hypogonadism, Raynaud's, and tobacco use. Cardiac catheterization at [facility] in 2012 showed mild 20% left main stenosis, 20% diffuse LAD disease, 20% left circumflex stenosis and subtotally occluded proximal right coronary artery with left-to-right collaterals. Ejection fraction was normal. He was treated medically. He had cardiac workup in 2016 for chest pain. Echocardiogram showed normal LV systolic function, mild left ventricular hypertrophy, moderate mitral regurgitation and aortic valve sclerosis without stenosis.  Treadmill nuclear stress test showed no evidence of ischemia with normal ejection fraction.  Prior abdominal aortic ultrasound in 2016 showed no evidence of aortic aneurysm. Carotid Doppler in 2017 showed mild nonobstructive atherosclerosis.  He had workup done by Dr. Novella Rob in October which included an echocardiogram which was unremarkable, stress test that was negative and carotid Doppler that showed mild nonobstructive disease bilaterally.  The patient has been doing reasonably well with no recent chest pain.  He describes stable exertional dyspnea.  Past Medical History:  Diagnosis Date  . CAD (coronary artery disease)    a. cath 10/20/2011: LM 20%, LAD diffuse 20%, LCx diffuse 20%, sub totally occluded pRCA 90% extending to mRCA 99% w/ left to right collats, med Rx recommeded, EF 87%.   Marland Kitchen DM2 (diabetes mellitus, type 2) (HCC)     . HLD (hyperlipidemia)   . HTN (hypertension)   . Hypogonadism in male   . Hypogonadism male   . Hypothyroidism   . Mitral regurgitation    a. echo 02/2015: EF 55-60%, mild LVH, moderate MR, mild aortic sclerosis without stenosis, mild TR  . Pernicious anemia   . Tobacco abuse     Past Surgical History:  Procedure Laterality Date  . CARD[facility]AC CATHETERIZATION     [facility]  . C[facility]IZATION     [facility]  . CHOLECYSTECTOMY    . HAND TENDON SURGERY       Current Outpatient Medications  Medication Sig Dispense Refill  . ALPRAZolam (XANAX) 0.5 MG tablet Take 0.5 mg by mouth at bedtime as needed for anxiety.    Marland Kitchen amLODipine (NORVASC) 5 MG tablet TAKE 1 TABLET BY MOUTH EVERY DAY 30 tablet 0  . aspirin 81 MG chewable tablet Chew 81 mg by mouth daily.    Marland Kitchen augmented betamethasone dipropionate (DIPROLENE-AF) 0.05 % cream betamethasone, augmented 0.05 % topical cream    . cyanocobalamin (,VITAMIN B-12,) 1000 MCG/ML injection Inject 1,000 mcg into the muscle once.    . gabapentin (NEURONTIN) 800 MG tablet Take 800 mg by mouth 3 (three) times daily.     Marland Kitchen levothyroxine (SYNTHROID, LEVOTHROID) 200 MCG tablet Take 200 mcg by mouth daily before breakfast.    . meloxicam (MOBIC) 15 MG tablet meloxicam 15 mg tablet qd.    . metoprolol tartrate (LOPRESSOR) 25 MG tablet TAKE 1 TABLET TWICE DAILY 180 tablet 1  . nitroGLYCERIN (NITROSTAT) 0.4 MG SL tablet Place 1 tablet (0.4 mg total) under the tongue every 5 (five)  minutes as needed for chest pain. 25 tablet 3  . pravastatin (PRAVACHOL) 20 MG tablet Take 1 tablet (20 mg total) by mouth every evening. 30 tablet 0  . testosterone cypionate (DEPOTESTOSTERONE CYPIONATE) 200 MG/ML injection Inject 200 mg into the muscle every 14 (fourteen) days.    . Vitamin D, Ergocalciferol, (DRISDOL) 50000 UNITS CAPS capsule Take 50,000 Units by mouth every 7 (seven) days.    Marland Kitchen. zolpidem (AMBIEN) 10 MG tablet Take 10 mg by mouth at bedtime.     No current  facility-administered medications for this visit.     Allergies:   Morphine    Social History:  The patient  reports that he has been smoking cigarettes.  He has a 10.00 pack-year smoking history. he has never used smokeless tobacco. He reports that he does not drink alcohol or use drugs.   Family History:  The patient's Family history is unknown by patient.    ROS:  Please see the history of present illness.   Otherwise, review of systems are positive for none.   All other systems are reviewed and negative.    PHYSICAL EXAM: VS:  BP (!) 142/64 (BP Location: Left Arm, Patient Position: Sitting, Cuff Size: Normal)   Pulse 76   Ht 6\' 2"  (1.88 m)   Wt 239 lb (108.4 kg)   BMI 30.69 kg/m  , BMI Body mass index is 30.69 kg/m. GEN: Well nourished, well developed, in no acute distress  HEENT: normal  Neck: no JVD, carotid bruits, or masses Cardiac: RRR; no murmurs, rubs, or gallops,no edema  Respiratory:  clear to auscultation bilaterally, normal work of breathing GI: soft, nontender, nondistended, + BS MS: no deformity or atrophy  Skin: warm and dry, no rash Neuro:  Strength and sensation are intact Psych: euthymic mood, full affect Distal pulses are normal  EKG:  EKG is ordered today. EKG showed normal sinus rhythm with no significant ST or T wave changes   Recent Labs: No results found for requested labs within last 8760 hours.    Lipid Panel    Component Value Date/Time   CHOL 119 03/06/2015 0708   TRIG 72 03/06/2015 0708   HDL 37 (L) 03/06/2015 0708   VLDL 14 03/06/2015 0708   LDLCALC 68 03/06/2015 0708      Wt Readings from Last 3 Encounters:  01/31/18 239 lb (108.4 kg)  10/18/16 226 lb (102.5 kg)  04/05/16 215 lb 8 oz (97.8 kg)        ASSESSMENT AND PLAN:  1.  Coronary artery disease involving native coronary arteries without angina: He is overall doing very well with no chest pain or angina.  Continue medical therapy.    2. Hyperlipidemia: Continue  treatment with pravastatin.  I reviewed most recent lipid profile which showed an LDL of 68.  Given his coronary artery disease and carotid disease, I recommend a more potent statin and thus I elected to switch him to atorvastatin.  Repeat lipid and liver profile in 6 weeks.  3. Essential hypertension: Blood pressure is mildly elevated today but usually is more controlled.  4. Raynaud's phenomenon :  Continue treatment with amlodipine.  Symptoms are overall stable.  5. Tobacco use: I again advised him to quit smoking.    Disposition:   FU with me in 12 months  Signed,  Lorine BearsMuhammad Michaila Kenney, MD  01/31/2018 2:54 PM    [facility]

## 2018-02-01 ENCOUNTER — Other Ambulatory Visit: Payer: Self-pay

## 2018-02-01 MED ORDER — ATORVASTATIN CALCIUM 40 MG PO TABS: 40.0000 mg | ORAL_TABLET | Freq: Every day | ORAL | 0 refills | Status: DC

## 2018-02-01 NOTE — Telephone Encounter (Signed)
Requested Prescriptions   Signed Prescriptions Disp Refills  . atorvastatin (LIPITOR) 40 MG tablet 90 tablet 0    Sig: Take 1 tablet (40 mg total) by mouth daily.    Authorizing Provider: Lorine BearsARIDA, MUHAMMAD A    Ordering User: Margrett RudSLAYTON, BRITTANY N

## 2018-02-01 NOTE — Telephone Encounter (Signed)
Requested Prescriptions   Signed Prescriptions Disp Refills  . atorvastatin (LIPITOR) 40 MG tablet 90 tablet 0    Sig: Take 1 tablet (40 mg total) by mouth daily.    Authorizing Provider: ARIDA, MUHAMMAD A    Ordering User: Darice Vicario N    

## 2018-02-28 ENCOUNTER — Telehealth: Payer: Self-pay | Admitting: Cardiovascular Disease

## 2018-02-28 ENCOUNTER — Other Ambulatory Visit: Payer: Self-pay

## 2018-02-28 MED ORDER — AMLODIPINE BESYLATE 5 MG PO TABS: 5.0000 mg | ORAL_TABLET | Freq: Every day | ORAL | 0 refills | Status: DC

## 2018-02-28 NOTE — Telephone Encounter (Signed)
amLODipine (NORVASC) 5 MG tablet 90 tablet 0 02/28/2018    Sig - Route: Take 1 tablet (5 mg total) by mouth daily. - Oral   Sent to pharmacy as: amLODipine (NORVASC) 5 MG tablet   E-Prescribing Status: Receipt confirmed by pharmacy (02/28/2018 1:38 PM EDT)   Pharmacy   [id number] [place], [place] - [address]

## 2018-02-28 NOTE — Telephone Encounter (Signed)
°*  STAT* If patient is at the pharmacy, call can be transferred to refill team.   1. Which medications need to be refilled? (please list name of each medication and dose if known) Amlodipine   2. Which pharmacy/location (including street and city if local pharmacy) is [facility] [place]   3. Do they need a 30 day or 90 day supply? 90 day

## 2018-04-29 ENCOUNTER — Other Ambulatory Visit: Payer: Self-pay | Admitting: Cardiovascular Disease

## 2018-05-28 ENCOUNTER — Other Ambulatory Visit: Payer: Self-pay | Admitting: Cardiovascular Disease

## 2018-06-06 ENCOUNTER — Other Ambulatory Visit: Payer: Self-pay

## 2018-06-06 MED ORDER — METOPROLOL TARTRATE 25 MG PO TABS: 25.0000 mg | ORAL_TABLET | Freq: Two times a day (BID) | ORAL | 3 refills | Status: DC

## 2018-06-06 NOTE — Telephone Encounter (Signed)
*  STAT* If patient is at the pharmacy, call can be transferred to refill team.   1. Which medications need to be refilled? (please list name of each medication and dose if known) Metoprolol  2. Which pharmacy/location (including street and city if local pharmacy) is medication to be sent to? [facility] [address]  3. Do they need a 30 day or 90 day supply? 90   

## 2018-09-29 ENCOUNTER — Other Ambulatory Visit: Payer: Self-pay | Admitting: Cardiovascular Disease

## 2018-12-20 ENCOUNTER — Telehealth: Payer: Self-pay | Admitting: Cardiovascular Disease

## 2018-12-20 MED ORDER — METOPROLOL TARTRATE 25 MG PO TABS: 25.0000 mg | ORAL_TABLET | Freq: Two times a day (BID) | ORAL | 3 refills | Status: DC

## 2018-12-20 MED ORDER — ATORVASTATIN CALCIUM 40 MG PO TABS: 40.0000 mg | ORAL_TABLET | Freq: Every day | ORAL | 3 refills | Status: DC

## 2018-12-20 NOTE — Telephone Encounter (Signed)
°*  STAT* If patient is at the pharmacy, call can be transferred to refill team.   1. Which medications need to be refilled? (please list name of each medication and dose if known)  Metoprolol 25 MG 1 tablet twice a day  Atorvastatin 40 MG 1 tablet daily at night    2. Which pharmacy/location (including street and city if local pharmacy) is medication to be sent to? [facility] [place] [facility] [employer]   3. Do they need a 30 day or 90 day supply? 90 day   Patient scheduled to see Dr Kirke Corin on 4/7

## 2019-03-13 ENCOUNTER — Ambulatory Visit: Payer: Medicare Other | Admitting: Cardiovascular Disease

## 2019-05-04 ENCOUNTER — Telehealth: Payer: Self-pay

## 2019-05-04 NOTE — Telephone Encounter (Signed)
Virtual Visit Pre-Appointment Phone Call  "[place], I am calling you today to discuss your upcoming appointment. We are currently trying to limit exposure to the virus that causes COVID-19 by seeing patients at home rather than in the office."  1. "What is the BEST phone number to call the day of the visit?" - include this in appointment notes  2. "Do you have or have access to (through a family member/friend) a smartphone with video capability that we can use for your visit?" a. If yes - list this number in appt notes as "cell" (if different from BEST phone #) and list the appointment type as a VIDEO visit in appointment notes b. If no - list the appointment type as a PHONE visit in appointment notes  3. Confirm consent - "In the setting of the current Covid19 crisis, you are scheduled for a video visit with your provider on May 15, 2019 at 4:20PM.  Just as we do with many in-office visits, in order for you to participate in this visit, we must obtain consent.  If you'd like, I can send this to your mychart (if signed up) or email for you to review.  Otherwise, I can obtain your verbal consent now.  All virtual visits are billed to your insurance company just like a normal visit would be.  By agreeing to a virtual visit, we'd like you to understand that the technology does not allow for your provider to perform an examination, and thus may limit your provider's ability to fully assess your condition. If your provider identifies any concerns that need to be evaluated in person, we will make arrangements to do so.  Finally, though the technology is pretty good, we cannot assure that it will always work on either your or our end, and in the setting of a video visit, we may have to convert it to a phone-only visit.  In either situation, we cannot ensure that we have a secure connection.  Are you willing to proceed?" STAFF: Did the patient verbally acknowledge consent to telehealth visit? Document YES/NO  here: YEs  4. Advise patient to be prepared - "Two hours prior to your appointment, go ahead and check your blood pressure, pulse, oxygen saturation, and your weight (if you have the equipment to check those) and write them all down. When your visit starts, your provider will ask you for this information. If you have an Apple Watch or Kardia device, please plan to have heart rate information ready on the day of your appointment. Please have a pen and paper handy nearby the day of the visit as well."  5. Give patient instructions for MyChart download to smartphone OR Doximity/Doxy.me as below if video visit (depending on what platform provider is using)  6. Inform patient they will receive a phone call 15 minutes prior to their appointment time (may be from unknown caller ID) so they should be prepared to answer    TELEPHONE CALL NOTE  EMER KAN has been deemed a candidate for a follow-up tele-health visit to limit community exposure during the Covid-19 pandemic. I spoke with the patient via phone to ensure availability of phone/video source, confirm preferred email & phone number, and discuss instructions and expectations.  I reminded Osie Cheeks to be prepared with any vital sign and/or heart rhythm information that could potentially be obtained via home monitoring, at the time of his visit. I reminded SOLOMONE TYRIE to expect a phone call prior to  his visit.  Tommie SamsBrandy L Newcomer McClain 05/04/2019 3:52 PM   INSTRUCTIONS FOR DOWNLOADING THE MYCHART APP TO SMARTPHONE  - The patient must first make sure to have activated MyChart and know their login information - If Apple, go to [employer] Store and type in MyChart in the search bar and download the app. If Android, ask patient to go to [employer] Play Store and type in [facility]Chart in the search bar and download the app. The app is free but as with any other app downloads, their phone may require them to verify saved payment information or  Apple/Android password.  - The patient will need to then log into the app with their MyChart username and password, and select [facility] as their healthcare provider to link the account. When it is time for your visit, go to the MyChart app, find appointments, and click Begin Video Visit. Be sure to Select Allow for your device to access the Microphone and Camera for your visit. You will then be connected, and your provider will be with you shortly.  **If they have any issues connecting, or need assistance please contact MyChart service desk [phone])**  **If using a computer, in order to ensure the best quality for their visit they will need to use either of the following Internet Browsers: [employer] Edge, or Google Chrome**  IF USING DOXIMITY or DOXY.ME - The patient will receive a link just prior to their visit by text.     FULL LENGTH CONSENT FOR TELE-HEALTH VISIT   I hereby voluntarily request, consent and authorize [facility] and its employed or contracted physicians, physician assistants, nurse practitioners or other licensed health care professionals (the Practitioner), to provide me with telemedicine health care services (the "Services") as deemed necessary by the treating Practitioner. I acknowledge and consent to receive the Services by the Practitioner via telemedicine. I understand that the telemedicine visit will involve communicating with the Practitioner through live audiovisual communication technology and the disclosure of certain medical information by electronic transmission. I acknowledge that I have been given the opportunity to request an in-person assessment or other available alternative prior to the telemedicine visit and am voluntarily participating in the telemedicine visit.  I understand that I have the right to withhold or withdraw my consent to the use of telemedicine in the course of my care at any time, without affecting my right to future care  or treatment, and that the Practitioner or I may terminate the telemedicine visit at any time. I understand that I have the right to inspect all information obtained and/or recorded in the course of the telemedicine visit and may receive copies of available information for a reasonable fee.  I understand that some of the potential risks of receiving the Services via telemedicine include:  Marland Kitchen. Delay or interruption in medical evaluation due to technological equipment failure or disruption; . Information transmitted may not be sufficient (e.g. poor resolution of images) to allow for appropriate medical decision making by the Practitioner; and/or  . In rare instances, security protocols could fail, causing a breach of personal health information.  Furthermore, I acknowledge that it is my responsibility to provide information about my medical history, conditions and care that is complete and accurate to the best of my ability. I acknowledge that Practitioner's advice, recommendations, and/or decision may be based on factors not within their control, such as incomplete or inaccurate data provided by me or distortions of diagnostic images or specimens that may result from electronic transmissions.  I understand that the practice of medicine is not an exact science and that Practitioner makes no warranties or guarantees regarding treatment outcomes. I acknowledge that I will receive a copy of this consent concurrently upon execution via email to the email address I last provided but may also request a printed copy by calling the office of [facility].    I understand that my insurance will be billed for this visit.   I have read or had this consent read to me. . I understand the contents of this consent, which adequately explains the benefits and risks of the Services being provided via telemedicine.  . I have been provided ample opportunity to ask questions regarding this consent and the Services and have had  my questions answered to my satisfaction. . I give my informed consent for the services to be provided through the use of telemedicine in my medical care  By participating in this telemedicine visit I agree to the above.

## 2019-05-15 ENCOUNTER — Other Ambulatory Visit: Payer: Self-pay

## 2019-05-15 ENCOUNTER — Encounter: Payer: Self-pay | Admitting: Cardiovascular Disease

## 2019-05-15 ENCOUNTER — Telehealth (INDEPENDENT_AMBULATORY_CARE_PROVIDER_SITE_OTHER): Payer: Medicare Other | Admitting: Cardiovascular Disease

## 2019-05-15 VITALS — BP 121/64 | HR 73 | Ht 74.0 in | Wt 226.0 lb

## 2019-05-15 DIAGNOSIS — I739 Peripheral vascular disease, unspecified: Secondary | ICD-10-CM

## 2019-05-15 DIAGNOSIS — I10 Essential (primary) hypertension: Secondary | ICD-10-CM | POA: Diagnosis not present

## 2019-05-15 DIAGNOSIS — I2510 Atherosclerotic heart disease of native coronary artery without angina pectoris: Secondary | ICD-10-CM | POA: Diagnosis not present

## 2019-05-15 NOTE — Progress Notes (Signed)
Virtual Visit via Telephone Note   This visit type was conducted due to national recommendations for restrictions regarding the COVID-19 Pandemic (e.g. social distancing) in an effort to limit this patient's exposure and mitigate transmission in our community.  Due to his co-morbid illnesses, this patient is at least at moderate risk for complications without adequate follow up.  This format is felt to be most appropriate for this patient at this time.  The patient did not have access to video technology/had technical difficulties with video requiring transitioning to audio format only (telephone).  All issues noted in this document were discussed and addressed.  No physical exam could be performed with this format.  Please refer to the patient's chart for his  consent to telehealth for [facility].   Date:  05/15/2019   ID:  Tyrone KEEVEN, DOB 18-Nov-1951, MRN [id number]  Patient Location: Home Provider Location: Office  PCP:  Lenard Simmer, MD  Cardiologist:  Kathlyn Sacramento, MD  Electrophysiologist:  None   Evaluation Performed:  Follow-Up Visit  Chief Complaint: Left arm numbness and bilateral leg pain  History of Present Illness:    Tyrone Blankenship is a 68 y.o. male who was reached via phone for a follow-up visit regarding coronary artery disease.  He has known history of one-vessel coronary artery disease, hypertension, type 2 diabetes, hyperlipidemia, hypothyroidism, hypogonadism, Raynaud's, and tobacco use. Cardiac catheterization at [facility] in 2012 showed mild 20% left main stenosis, 20% diffuse LAD disease, 20% left circumflex stenosis and subtotally occluded proximal right coronary artery with left-to-right collaterals. Ejection fraction was normal. He was treated medically. He had cardiac workup in 2016 for chest pain. Echocardiogram showed normal LV systolic function, mild left ventricular hypertrophy, moderate mitral regurgitation and aortic valve sclerosis without  stenosis.  Treadmill nuclear stress test showed no evidence of ischemia with normal ejection fraction.  Prior abdominal aortic ultrasound in 2016 showed no evidence of aortic aneurysm. Carotid Doppler in 2017 showed mild nonobstructive atherosclerosis.  He did get further cardiac studies done by Dr. Shelda Jakes since then but I do not have these results.  The patient has been doing reasonably well with no recent chest pain.  He describes stable exertional dyspnea. He has been bothered by numbness in his left hand and arm for the last few months.  In addition, he had pain and discomfort in both legs when he tried to play golf.  He is not aware of history of peripheral arterial disease.  He continues to smoke.  The patient does not have symptoms concerning for COVID-19 infection (fever, chills, cough, or new shortness of breath).    Past Medical History:  Diagnosis Date   CAD (coronary artery disease)    a. cath 10/20/2011: LM 20%, LAD diffuse 20%, LCx diffuse 20%, sub totally occluded pRCA 90% extending to mRCA 99% w/ left to right collats, med Rx recommeded, EF 87%.    DM2 (diabetes mellitus, type 2) ([place])    HLD (hyperlipidemia)    HTN (hypertension)    Hypogonadism in male    Hypogonadism male    Hypothyroidism    Mitral regurgitation    a. echo 02/2015: EF 55-60%, mild LVH, moderate MR, mild aortic sclerosis without stenosis, mild TR   Pernicious anemia    Tobacco abuse    Past Surgical History:  Procedure Laterality Date   [place]  HAND TENDON SURGERY       Current Meds  Medication Sig   ALPRAZolam (XANAX) 0.5 MG tablet Take 0.5 mg by mouth at bedtime as needed for anxiety.   amLODipine (NORVASC) 5 MG tablet TAKE 1 TABLET BY MOUTH EVERY DAY   aspirin 81 MG chewable tablet Chew 81 mg by mouth daily.   atorvastatin (LIPITOR) 40 MG tablet Take 1 tablet (40 mg total) by mouth  daily.   augmented betamethasone dipropionate (DIPROLENE-AF) 0.05 % cream betamethasone, augmented 0.05 % topical cream   cyanocobalamin (,VITAMIN B-12,) 1000 MCG/ML injection Inject 1,000 mcg into the muscle once.   gabapentin (NEURONTIN) 800 MG tablet Take 800 mg by mouth 3 (three) times daily.    levothyroxine (SYNTHROID, LEVOTHROID) 200 MCG tablet Take 200 mcg by mouth daily before breakfast.   meloxicam (MOBIC) 15 MG tablet meloxicam 15 mg tablet qd.   metoprolol tartrate (LOPRESSOR) 25 MG tablet Take 1 tablet (25 mg total) by mouth 2 (two) times daily.   nitroGLYCERIN (NITROSTAT) 0.4 MG SL tablet Place 1 tablet (0.4 mg total) under the tongue every 5 (five) minutes as needed for chest pain.   testosterone cypionate (DEPOTESTOSTERONE CYPIONATE) 200 MG/ML injection Inject 200 mg into the muscle every 14 (fourteen) days.   Vitamin D, Ergocalciferol, (DRISDOL) 50000 UNITS CAPS capsule Take 50,000 Units by mouth every 7 (seven) days.   zolpidem (AMBIEN) 10 MG tablet Take 10 mg by mouth at bedtime.     Allergies:   Morphine   Social History   Tobacco Use   Smoking status: Current Some Day Smoker    Packs/day: 0.25    Years: 40.00    Pack years: 10.00    Types: Cigarettes   Smokeless tobacco: Never Used  Substance Use Topics   Alcohol use: No   Drug use: No     Family Hx: The patient's Family history is unknown by patient.  ROS:   Please see the history of present illness.     All other systems reviewed and are negative.   Prior CV studies:   The following studies were reviewed today:    Labs/Other Tests and Data Reviewed:    EKG:  No ECG reviewed.  Recent Labs: No results found for requested labs within last 8760 hours.   Recent Lipid Panel Lab Results  Component Value Date/Time   CHOL 119 03/06/2015 07:08 AM   TRIG 72 03/06/2015 07:08 AM   HDL 37 (L) 03/06/2015 07:08 AM   LDLCALC 68 03/06/2015 07:08 AM    Wt Readings from Last 3 Encounters:    05/15/19 226 lb (102.5 kg)  01/31/18 239 lb (108.4 kg)  10/18/16 226 lb (102.5 kg)     Objective:    Vital Signs:  BP 121/64    Pulse 73    Ht 6\' 2"  (1.88 m)    Wt 226 lb (102.5 kg)    BMI 29.02 kg/m    VITAL SIGNS:  reviewed  ASSESSMENT & PLAN:    1.  Coronary artery disease involving native coronary arteries without angina: He is overall doing very well with no chest pain or angina.  Continue medical therapy.    2. Hyperlipidemia: Continue treatment with atorvastatin.  Previous LDL was 68 on this dose.  3. Essential hypertension: Blood pressure is  well controlled  4. Raynaud's phenomenon :  Continue treatment with amlodipine.  Symptoms are overall stable.  5. Tobacco use: I again advised him to quit smoking.  6.  Bilateral exertional  leg pain: Possibly due to peripheral arterial disease especially with prolonged history of tobacco use.  I requested lower extremity arterial Doppler.   COVID-19 Education: The signs and symptoms of COVID-19 were discussed with the patient and how to seek care for testing (follow up with PCP or arrange E-visit).  The importance of social distancing was discussed today.  Time:   Today, I have spent 12 minutes with the patient with telehealth technology discussing the above problems.     Medication Adjustments/Labs and Tests Ordered: Current medicines are reviewed at length with the patient today.  Concerns regarding medicines are outlined above.   Tests Ordered: No orders of the defined types were placed in this encounter.   Medication Changes: No orders of the defined types were placed in this encounter.   Disposition:  Follow up in 3 month(s)  Signed, Lorine BearsMuhammad Elanah Osmanovic, MD  05/15/2019 4:56 PM    [facility]

## 2019-05-15 NOTE — Patient Instructions (Signed)
Medication Instructions:  Continue same medications If you need a refill on your cardiac medications before your next appointment, please call your pharmacy.   Lab work: None If you have labs (blood work) drawn today and your tests are completely normal, you will receive your results only by: Marland Kitchen MyChart Message (if you have MyChart) OR . A paper copy in the mail If you have any lab test that is abnormal or we need to change your treatment, we will call you to review the results.  Testing/Procedures: Lower extremity arterial Doppler  Follow-Up: At [facility], you and your health needs are our priority.  As part of our continuing mission to provide you with exceptional heart care, we have created designated Provider Care Teams.  These Care Teams include your primary Cardiologist (physician) and Advanced Practice Providers (APPs -  Physician Assistants and Nurse Practitioners) who all work together to provide you with the care you need, when you need it. You will need a follow up appointment in 3 months.  Please call our office 2 months in advance to schedule this appointment.  You may see Kathlyn Sacramento, MD or one of the following Advanced Practice Providers on your designated Care Team:   Murray Hodgkins, NP Christell Faith, PA-C . Marrianne Mood, PA-C

## 2019-05-16 NOTE — Addendum Note (Signed)
Addended by: Lamar Laundry on: 05/16/2019 07:16 AM   Modules accepted: Orders

## 2019-06-04 ENCOUNTER — Other Ambulatory Visit: Payer: Self-pay | Admitting: Cardiovascular Disease

## 2019-06-04 DIAGNOSIS — I739 Peripheral vascular disease, unspecified: Secondary | ICD-10-CM

## 2019-06-29 ENCOUNTER — Telehealth: Payer: Self-pay

## 2019-06-29 ENCOUNTER — Other Ambulatory Visit: Payer: Self-pay | Admitting: Cardiovascular Disease

## 2019-06-29 DIAGNOSIS — I739 Peripheral vascular disease, unspecified: Secondary | ICD-10-CM

## 2019-06-29 NOTE — Telephone Encounter (Signed)

## 2019-07-02 ENCOUNTER — Other Ambulatory Visit: Payer: Self-pay

## 2019-07-02 ENCOUNTER — Ambulatory Visit (INDEPENDENT_AMBULATORY_CARE_PROVIDER_SITE_OTHER): Payer: Medicare Other

## 2019-07-02 DIAGNOSIS — I739 Peripheral vascular disease, unspecified: Secondary | ICD-10-CM

## 2019-07-04 ENCOUNTER — Telehealth: Payer: Self-pay

## 2019-07-04 NOTE — Telephone Encounter (Signed)
Pt made aware of LE study with verbalized understanding.

## 2019-07-04 NOTE — Telephone Encounter (Signed)
-----   Message from Wellington Hampshire, MD sent at 07/03/2019  9:48 AM EDT -----  Normal ABI with no evidence of peripheral arterial disease.

## 2019-07-18 ENCOUNTER — Other Ambulatory Visit: Payer: Self-pay | Admitting: Cardiovascular Disease

## 2019-10-09 ENCOUNTER — Ambulatory Visit (INDEPENDENT_AMBULATORY_CARE_PROVIDER_SITE_OTHER): Payer: Medicare Other | Admitting: Cardiovascular Disease

## 2019-10-09 ENCOUNTER — Encounter: Payer: Self-pay | Admitting: Cardiovascular Disease

## 2019-10-09 ENCOUNTER — Other Ambulatory Visit: Payer: Self-pay

## 2019-10-09 VITALS — BP 132/60 | HR 76 | Temp 97.9°F | Ht 74.0 in | Wt 242.5 lb

## 2019-10-09 DIAGNOSIS — I10 Essential (primary) hypertension: Secondary | ICD-10-CM | POA: Diagnosis not present

## 2019-10-09 DIAGNOSIS — E785 Hyperlipidemia, unspecified: Secondary | ICD-10-CM | POA: Diagnosis not present

## 2019-10-09 DIAGNOSIS — I2510 Atherosclerotic heart disease of native coronary artery without angina pectoris: Secondary | ICD-10-CM

## 2019-10-09 DIAGNOSIS — Z720 Tobacco use: Secondary | ICD-10-CM

## 2019-10-09 NOTE — Progress Notes (Signed)
Cardiology Office Note   Date:  10/09/2019   ID:  Tyrone Blankenship, Tyrone Blankenship [birth year], MRN [id number]  PCP:  Alan Mulder, MD  Cardiologist:   Lorine Bears, MD   Chief Complaint  Patient presents with  . other    3 month follow up. Meds reviewed by the pt. verbally. Pt. c/o shortness of breath.       History of Present Illness: Tyrone Blankenship is a 68 y.o. male who presents for a follow-up visit regarding coronary artery disease.  He has known history of one-vessel coronary artery disease, hypertension, type 2 diabetes, hyperlipidemia, hypothyroidism, hypogonadism, Raynaud's, and tobacco use. Cardiac catheterization at [facility] in 2012 showed mild 20% left main stenosis, 20% diffuse LAD disease, 20% left circumflex stenosis and subtotally occluded proximal right coronary artery with left-to-right collaterals. Ejection fraction was normal. He was treated medically. He had cardiac workup in 2016 for chest pain. Echocardiogram showed normal LV systolic function, mild left ventricular hypertrophy, moderate mitral regurgitation and aortic valve sclerosis without stenosis.  Treadmill nuclear stress test showed no evidence of ischemia with normal ejection fraction.  Prior abdominal aortic ultrasound in 2016 showed no evidence of aortic aneurysm. Carotid Doppler in 2017 showed mild nonobstructive atherosclerosis.   The patient has been doing reasonably well with no recent chest pain.  He describes stable exertional dyspnea. Most recently, he complained of bilateral leg pain with exertion.  I did lower extremity arterial Doppler which showed normal ABI and toe pressure.  He also complains of left arm numbness.  He continues to smoke.    Past Medical History:  Diagnosis Date  . CAD (coronary artery disease)    a. cath 10/20/2011: LM 20%, LAD diffuse 20%, LCx diffuse 20%, sub totally occluded pRCA 90% extending to mRCA 99% w/ left to right collats, med Rx recommeded, EF 87%.   Marland Kitchen DM2  (diabetes mellitus, type 2) ([facility])   . HLD (hyperlipidemia)   . HTN (hypertension)   . Hypogonadism in male   . Hypogonadism male   . Hypothyroidism   . Mitral regurgitation    a. echo 02/2015: EF 55-60%, mild LVH, moderate MR, mild aortic sclerosis without stenosis, mild TR  . Pernicious anemia   . Tobacco abuse     Past Surgical History:  Procedure Laterality Date  . CARDIAC CATHETERIZATION     [facility]  . CARDIAC CATHETERIZATION     [facility]  . CHOLECYSTECTOMY    . HAND TENDON SURGERY       Current Outpatient Medications  Medication Sig Dispense Refill  . ALPRAZolam (XANAX) 0.5 MG tablet Take 0.5 mg by mouth at bedtime as needed for anxiety.    Marland Kitchen amLODipine (NORVASC) 5 MG tablet TAKE 1 TABLET BY MOUTH EVERY DAY 90 tablet 3  . aspirin 81 MG chewable tablet Chew 81 mg by mouth daily.    Marland Kitchen atorvastatin (LIPITOR) 40 MG tablet Take 1 tablet (40 mg total) by mouth daily. 90 tablet 3  . augmented betamethasone dipropionate (DIPROLENE-AF) 0.05 % cream betamethasone, augmented 0.05 % topical cream    . cyanocobalamin (,VITAMIN B-12,) 1000 MCG/ML injection Inject 1,000 mcg into the muscle once.    . gabapentin (NEURONTIN) 800 MG tablet Take 800 mg by mouth 3 (three) times daily.     Marland Kitchen levothyroxine (SYNTHROID, LEVOTHROID) 200 MCG tablet Take 200 mcg by mouth daily before breakfast.    . meloxicam (MOBIC) 15 MG tablet meloxicam 15 mg tablet qd.    . metoprolol tartrate (  LOPRESSOR) 25 MG tablet Take 1 tablet (25 mg total) by mouth 2 (two) times daily. 180 tablet 3  . nitroGLYCERIN (NITROSTAT) 0.4 MG SL tablet Place 1 tablet (0.4 mg total) under the tongue every 5 (five) minutes as needed for chest pain. 25 tablet 3  . testosterone cypionate (DEPOTESTOSTERONE CYPIONATE) 200 MG/ML injection Inject 200 mg into the muscle every 14 (fourteen) days.    . Vitamin D, Ergocalciferol, (DRISDOL) 50000 UNITS CAPS capsule Take 50,000 Units by mouth every 7 (seven) days.    Marland Kitchen zolpidem (AMBIEN) 10 MG tablet Take  10 mg by mouth at bedtime.     No current facility-administered medications for this visit.     Allergies:   Morphine    Social History:  The patient  reports that he has been smoking cigarettes. He has a 10.00 pack-year smoking history. He has never used smokeless tobacco. He reports that he does not drink alcohol or use drugs.   Family History:  The patient's Family history is unknown by patient.    ROS:  Please see the history of present illness.   Otherwise, review of systems are positive for none.   All other systems are reviewed and negative.    PHYSICAL EXAM: VS:  BP 132/60 (BP Location: Left Arm, Patient Position: Sitting, Cuff Size: Normal)   Pulse 76   Temp 97.9 F (36.6 C)   Ht 6\' 2"  (1.88 m)   Wt 242 lb 8 oz (110 kg)   BMI 31.14 kg/m  , BMI Body mass index is 31.14 kg/m. GEN: Well nourished, well developed, in no acute distress  HEENT: normal  Neck: no JVD, carotid bruits, or masses Cardiac: RRR; no murmurs, rubs, or gallops,no edema  Respiratory:  clear to auscultation bilaterally, normal work of breathing GI: soft, nontender, nondistended, + BS MS: no deformity or atrophy  Skin: warm and dry, no rash Neuro:  Strength and sensation are intact Psych: euthymic mood, full affect Distal pulses are normal  EKG:  EKG is ordered today. EKG showed normal sinus rhythm with motion artifact   Recent Labs: No results found for requested labs within last 8760 hours.    Lipid Panel    Component Value Date/Time   CHOL 119 03/06/2015 0708   TRIG 72 03/06/2015 0708   HDL 37 (L) 03/06/2015 0708   VLDL 14 03/06/2015 0708   LDLCALC 68 03/06/2015 0708      Wt Readings from Last 3 Encounters:  10/09/19 242 lb 8 oz (110 kg)  05/15/19 226 lb (102.5 kg)  01/31/18 239 lb (108.4 kg)        ASSESSMENT AND PLAN:  1.  Coronary artery disease involving native coronary arteries without angina: He is overall doing very well with no chest pain or angina.  Continue  medical therapy.    2. Hyperlipidemia: Continue treatment with atorvastatin.  Previous LDL was 68 on this dose.  3. Essential hypertension: Blood pressure is  well controlled  4. Raynaud's phenomenon :  Continue treatment with amlodipine.  Symptoms are overall stable.  Left arm and hand numbness does not seem to be vascular in etiology.  Suspect cervical spine etiology.  5. Tobacco use: I again advised him to quit smoking.  6.  Bilateral exertional leg pain: Normal ABI.  Normal vascular exam.   Disposition:   FU with me in 12 months  Signed,  Kathlyn Sacramento, MD  10/09/2019 4:52 PM    [place]

## 2019-10-09 NOTE — Patient Instructions (Signed)
Medication Instructions:  Your physician recommends that you continue on your current medications as directed. Please refer to the Current Medication list given to you today.  *If you need a refill on your cardiac medications before your next appointment, please call your pharmacy*  Lab Work: None ordered If you have labs (blood work) drawn today and your tests are completely normal, you will receive your results only by: . MyChart Message (if you have MyChart) OR . A paper copy in the mail If you have any lab test that is abnormal or we need to change your treatment, we will call you to review the results.  Testing/Procedures: None ordered  Follow-Up: At [facility], you and your health needs are our priority.  As part of our continuing mission to provide you with exceptional heart care, we have created designated Provider Care Teams.  These Care Teams include your primary Cardiologist (physician) and Advanced Practice Providers (APPs -  Physician Assistants and Nurse Practitioners) who all work together to provide you with the care you need, when you need it.  Your next appointment:   12 month(s)  The format for your next appointment:   In Person  Provider:    You may see Muhammad Arida, MD or one of the following Advanced Practice Providers on your designated Care Team:    Christopher Berge, NP  Ryan Dunn, PA-C  Jacquelyn Visser, PA-C   Other Instructions N/A  

## 2019-12-31 ENCOUNTER — Other Ambulatory Visit: Payer: Self-pay

## 2019-12-31 MED ORDER — METOPROLOL TARTRATE 25 MG PO TABS: 25.0000 mg | ORAL_TABLET | Freq: Two times a day (BID) | ORAL | 2 refills | Status: DC

## 2020-01-28 ENCOUNTER — Other Ambulatory Visit: Payer: Self-pay | Admitting: Cardiovascular Disease

## 2020-01-28 MED ORDER — ATORVASTATIN CALCIUM 40 MG PO TABS: 40.0000 mg | ORAL_TABLET | Freq: Every day | ORAL | 3 refills | Status: DC

## 2020-01-28 NOTE — Telephone Encounter (Signed)
*  STAT* If patient is at the pharmacy, call can be transferred to refill team.   1. Which medications need to be refilled? (please list name of each medication and dose if known)  Atorvastatin 40 mg daily  2. Which pharmacy/location (including street and city if local pharmacy) is medication to be sent to?  [facility] (by [address])  3. Do they need a 30 day or 90 day supply? 90

## 2020-01-28 NOTE — Telephone Encounter (Signed)
Requested Prescriptions   Signed Prescriptions Disp Refills  . atorvastatin (LIPITOR) 40 MG tablet 90 tablet 3    Sig: Take 1 tablet (40 mg total) by mouth daily.    Authorizing Provider: ARIDA, MUHAMMAD A    Ordering User: Omya Winfield C    

## 2020-01-29 ENCOUNTER — Other Ambulatory Visit: Payer: Self-pay | Admitting: *Deleted

## 2020-01-29 MED ORDER — ATORVASTATIN CALCIUM 40 MG PO TABS: 40.0000 mg | ORAL_TABLET | Freq: Every day | ORAL | 3 refills | Status: DC

## 2020-01-29 NOTE — Telephone Encounter (Signed)
Requested Prescriptions   Signed Prescriptions Disp Refills  . atorvastatin (LIPITOR) 40 MG tablet 90 tablet 3    Sig: Take 1 tablet (40 mg total) by mouth daily.    Authorizing Provider: ARIDA, MUHAMMAD A    Ordering User: Kahlil Cowans C    

## 2020-09-20 ENCOUNTER — Other Ambulatory Visit: Payer: Self-pay | Admitting: Cardiovascular Disease

## 2020-10-15 ENCOUNTER — Encounter: Payer: Self-pay | Admitting: Nurse Practitioner

## 2020-10-15 ENCOUNTER — Other Ambulatory Visit: Payer: Self-pay

## 2020-10-15 ENCOUNTER — Ambulatory Visit (INDEPENDENT_AMBULATORY_CARE_PROVIDER_SITE_OTHER): Payer: Medicare Other | Admitting: Nurse Practitioner

## 2020-10-15 VITALS — BP 144/64 | HR 62 | Ht 74.0 in | Wt 207.0 lb

## 2020-10-15 DIAGNOSIS — I10 Essential (primary) hypertension: Secondary | ICD-10-CM | POA: Diagnosis not present

## 2020-10-15 DIAGNOSIS — I2510 Atherosclerotic heart disease of native coronary artery without angina pectoris: Secondary | ICD-10-CM

## 2020-10-15 DIAGNOSIS — E785 Hyperlipidemia, unspecified: Secondary | ICD-10-CM | POA: Diagnosis not present

## 2020-10-15 MED ORDER — METOPROLOL TARTRATE 25 MG PO TABS: 25.0000 mg | ORAL_TABLET | Freq: Two times a day (BID) | ORAL | 3 refills | Status: DC

## 2020-10-15 MED ORDER — ATORVASTATIN CALCIUM 40 MG PO TABS: 40.0000 mg | ORAL_TABLET | Freq: Every day | ORAL | 3 refills | Status: DC

## 2020-10-15 NOTE — Patient Instructions (Addendum)
Medication Instructions: Your physician recommends that you continue on your current medications as directed. Please refer to the Current Medication list given to you today.   *If you need a refill on your cardiac medications before your next appointment, please call your pharmacy*             Lab Work:  None   If you have labs (blood work) drawn today and your tests are completely normal, you will receive your results only by: Marland Kitchen MyChart Message (if you have MyChart) OR . A paper copy in the mail If you have any lab test that is abnormal or we need to change your treatment, we will call you to review the results.   Testing/Procedures:  None     Follow-Up: At [facility], you and your health needs are our priority.  As part of our continuing mission to provide you with exceptional heart care, we have created designated Provider Care Teams.  These Care Teams include your primary Cardiologist (physician) and Advanced Practice Providers (APPs -  Physician Assistants and Nurse Practitioners) who all work together to provide you with the care you need, when you need it.  We recommend signing up for the patient portal called "MyChart".  Sign up information is provided on this After Visit Summary.  MyChart is used to connect with patients for Virtual Visits (Telemedicine).  Patients are able to view lab/test results, encounter notes, upcoming appointments, etc.  Non-urgent messages can be sent to your provider as well.   To learn more about what you can do with MyChart, go to [email or web address].    Your next appointment:   1 year(s)  The format for your next appointment:   In Person  Provider:   You may see Lorine Bears, MD or one of the following Advanced Practice Providers on your designated Care Team:    Nicolasa Ducking, NP  Eula Listen, PA-C  Marisue Ivan, PA-C  Cadence McMurray, [place]

## 2020-10-15 NOTE — Progress Notes (Signed)
Office Visit    Patient Name: Tyrone Blankenship Date of Encounter: 10/15/2020  Primary Care Provider:  Alan Mulder, MD Primary Cardiologist:  Lorine Bears, MD  Chief Complaint    69 year old male with a history of CAD, hypertension, diabetes, hyperlipidemia, hypothyroidism, hypogonadism, Raynaud's, and tobacco use, who presents for follow-up related to CAD.  Past Medical History    Past Medical History:  Diagnosis Date  . CAD (coronary artery disease)    a. cath 10/20/2011: LM 20%, LAD diffuse 20%, LCx diffuse 20%, sub totally occluded pRCA 90% extending to mRCA 99% w/ left to right collats, med Rx recommeded, EF 87%.   . Claudication (HCC)    a. 06/2019 ABI: R 1.18, L 1.15.  Marland Kitchen DM2 (diabetes mellitus, type 2) (HCC)   . HLD (hyperlipidemia)   . HTN (hypertension)   . Hypogonadism in male   . Hypogonadism male   . Hypothyroidism   . Mitral regurgitation    a. echo 02/2015: EF 55-60%, mild LVH, moderate MR, mild aortic sclerosis without stenosis, mild TR  . Pernicious anemia   . Tobacco abuse    Past Surgical History:  Procedure Laterality Date  . CARDIAC CATHETERIZATION     [facility]  . CARD[facility]AC CATHETERIZATION     [facility]  . CHOLECYSTECTOMY    . HAND TENDON SURGERY      Allergies  Allergies  Allergen Reactions  . Morphine Rash    History of Present Illness    69 year old male with the above past medical history including CAD, hypertension, diabetes, hyperlipidemia, hypothyroidism, hypogonadism, Raynaud's, and tobacco use.  He previously underwent diagnostic catheterization at [facility] in 2012 showing mild, 20% left main stenosis, 20% diffuse LAD disease, 20% left circumflex stenosis, and a subtotally occluded proximal right coronary artery with left-to-right collaterals.  EF was normal.  He has been treated medically since.  Cardiac work-up in 2016 for chest pain including echocardiogram showed normal LV systolic function, mild LVH, moderate mitral regurgitation and  aortic valve sclerosis without stenosis.  Treadmill nuclear stress test showed no evidence of ischemia or infarct with normal LV function.  Prior abdominal aortic ultrasound in 2016 showed no evidence of aortic aneurysm.  Carotid Dopplers in 2017 showed mild, nonobstructive disease.  In July 2020, he underwent ABIs in the setting of claudication symptoms, and these were normal.  Mr. Hoe was last seen in cardiology clinic in November 2020, at which time he was doing well.  About 3 months ago, he came down with shingles on his scalp and is just now recovering from that.  It did disrupt his day-to-day activities, as he was playing a lot of golf and also walking regularly (approximate 25,000 steps a week).  In the setting of less activity, he has gained a little bit of weight and he recognizes that he needs to begin exercising again.  He has not been having any chest pain or dyspnea and further denies palpitations, PND, orthopnea, dizziness, syncope, edema, or early satiety.  Home Medications    Prior to Admission medications   Medication Sig Start Date End Date Taking? Authorizing Provider  ALPRAZolam Prudy Feeler) 0.5 MG tablet Take 0.5 mg by mouth at bedtime as needed for anxiety.    [provider]  amLODipine (NORVASC) 5 MG tablet TAKE 1 TABLET BY MOUTH EVERY DAY 09/22/20   Iran Ouch, MD  aspirin 81 MG chewable tablet Chew 81 mg by mouth daily.    [provider]  atorvastatin (LIPITOR) 40  MG tablet Take 1 tablet (40 mg total) by mouth daily. 01/29/20   Iran Ouch, MD  augmented betamethasone dipropionate (DIPROLENE-AF) 0.05 % cream betamethasone, augmented 0.05 % topical cream    [provider]  cyanocobalamin (,VITAMIN B-12,) 1000 MCG/ML injection Inject 1,000 mcg into the muscle once.    [provider]  gabapentin (NEURONTIN) 800 MG tablet Take 800 mg by mouth 3 (three) times daily.  01/04/18   [provider]  levothyroxine (SYNTHROID,  LEVOTHROID) 200 MCG tablet Take 200 mcg by mouth daily before breakfast.    [provider]  meloxicam (MOBIC) 15 MG tablet meloxicam 15 mg tablet qd.    [provider]  metoprolol tartrate (LOPRESSOR) 25 MG tablet Take 1 tablet (25 mg total) by mouth 2 (two) times daily. 12/31/19   Iran Ouch, MD  nitroGLYCERIN (NITROSTAT) 0.4 MG SL tablet Place 1 tablet (0.4 mg total) under the tongue every 5 (five) minutes as needed for chest pain. 03/13/15   Iran Ouch, MD  testosterone cypionate (DEPOTESTOSTERONE CYPIONATE) 200 MG/ML injection Inject 200 mg into the muscle every 14 (fourteen) days.    [provider]  Vitamin D, Ergocalciferol, (DRISDOL) 50000 UNITS CAPS capsule Take 50,000 Units by mouth every 7 (seven) days.    [provider]  zolpidem (AMBIEN) 10 MG tablet Take 10 mg by mouth at bedtime.    [provider]    Review of Systems    He denies chest pain, palpitations, dyspnea, pnd, orthopnea, n, v, dizziness, syncope, edema, weight gain, or early satiety.  All other systems reviewed and are otherwise negative except as noted above.  Physical Exam    VS:  BP (!) 144/64 (BP Location: Left Arm, Patient Position: Sitting, Cuff Size: Normal)   Pulse 62   Ht 6\' 2"  (1.88 m)   Wt 207 lb (93.9 kg)   SpO2 98%   BMI 26.58 kg/m  , BMI Body mass index is 26.58 kg/m. GEN: Well nourished, well developed, in no acute distress. HEENT: normal. Neck: Supple, no JVD, carotid bruits, or masses. Cardiac: RRR, no murmurs, rubs, or gallops. No clubbing, cyanosis, edema.  Radials/PT 2+ and equal bilaterally.  Respiratory:  Respirations regular and unlabored, clear to auscultation bilaterally. GI: Soft, nontender, nondistended, BS + x 4. MS: no deformity or atrophy. Skin: warm and dry, no rash. Neuro:  Strength and sensation are intact. Psych: Normal affect.  Accessory Clinical Findings    ECG personally reviewed by me today -regular sinus  rhythm, 62 - no acute changes.  Echo from October 2021 performed at PCPs office: Normal EF with mild MR, TR. Carotid ultrasound performed October 2021 at PCPs office: 50% left CCA stenosis. Lab work dated July 28, 2020: Sodium 138, potassium 4.5, chloride 99, BUN, 10, creatinine 1.05, glucose 91 Calcium 9.5, phosphorus 3.9 Total protein 6.3, albumin 4.3, total bilirubin 0.3, alkaline phosphatase 82, AST 14, ALT 12 Iron 90 Total cholesterol 128, triglycerides 84, HDL 54, LDL 58 TSH 7.350, T4 8.1, T3 uptake 30, free thyroxine index 2.4, free T4 1.66 PSA 0.3 Hemoglobin 13.9, hematocrit 41.6, WBC 8.1, platelets 246 Hemoglobin A1c 5.7 C-reactive protein 1.52  Assessment & Plan    1.  Coronary artery disease: Status post catheterization in 2012 revealing a 99% stenosis in the mid RCA with left-to-right collaterals.  He has been medically managed and doing well.  He remains generally active though exercise is fallen off in the past 3 months in the setting of  a shingles infection.  He does not experience chest pain or dyspnea.  He remains on aspirin, statin, beta-blocker therapy.  2.  Essential hypertension: Blood pressure elevated today at 144/64 though he notes this typically runs in the 1 teens to 120s.  He will continue to follow this at home and also continue amlodipine, and beta-blocker therapy.  3.  Hyperlipidemia: Recent lipids in August with a total cholesterol 128 and LDL of 58.  He remains on statin therapy, which will be refilled today.  LFTs were also normal in August.  4.  Hypothyroidism: Levothyroxine dose adjusted in August in the setting of mild elevation of TSH of 7.350.  Follow-up primary care.  5.  Disposition: Follow-up in 1 year or sooner if necessary.   Nicolasa Ducking, NP 10/15/2020, 5:40 PM

## 2020-12-21 ENCOUNTER — Other Ambulatory Visit: Payer: Self-pay | Admitting: Cardiovascular Disease

## 2021-02-27 ENCOUNTER — Other Ambulatory Visit (HOSPITAL_COMMUNITY): Payer: Self-pay | Admitting: Oculoplastics Ophthalmology

## 2021-02-27 ENCOUNTER — Other Ambulatory Visit (HOSPITAL_COMMUNITY): Payer: Self-pay | Admitting: Anesthesiology

## 2021-02-27 ENCOUNTER — Other Ambulatory Visit: Payer: Self-pay | Admitting: Oculoplastics Ophthalmology

## 2021-02-27 DIAGNOSIS — E0500 Thyrotoxicosis with diffuse goiter without thyrotoxic crisis or storm: Secondary | ICD-10-CM

## 2021-02-27 DIAGNOSIS — H0520 Unspecified exophthalmos: Secondary | ICD-10-CM

## 2021-03-16 ENCOUNTER — Ambulatory Visit: Payer: Medicare Other

## 2021-03-20 ENCOUNTER — Ambulatory Visit
Admission: RE | Admit: 2021-03-20 | Discharge: 2021-03-20 | Disposition: A | Discharge status: Home / Self Care | Payer: Medicare Other | Source: Ambulatory Visit | Attending: Oculoplastics Ophthalmology | Admitting: Oculoplastics Ophthalmology

## 2021-03-20 ENCOUNTER — Other Ambulatory Visit: Payer: Self-pay

## 2021-03-20 DIAGNOSIS — E0500 Thyrotoxicosis with diffuse goiter without thyrotoxic crisis or storm: Secondary | ICD-10-CM | POA: Diagnosis not present

## 2021-03-20 DIAGNOSIS — H0520 Unspecified exophthalmos: Secondary | ICD-10-CM | POA: Diagnosis present

## 2021-06-19 ENCOUNTER — Telehealth: Payer: Self-pay | Admitting: Cardiovascular Disease

## 2021-06-19 MED ORDER — ATORVASTATIN CALCIUM 40 MG PO TABS: 40.0000 mg | ORAL_TABLET | Freq: Every day | ORAL | 0 refills | Status: DC

## 2021-06-19 NOTE — Telephone Encounter (Signed)
*  STAT* If patient is at the pharmacy, call can be transferred to refill team.   1. Which medications need to be refilled? (please list name of each medication and dose if known)   Atorvastatin 40 mg po q d   2. Which pharmacy/location (including street and city if local pharmacy) is medication to be sent to? [facility] [address]   3. Do they need a 30 day or 90 day supply? 90

## 2021-06-19 NOTE — Telephone Encounter (Signed)
Requested Prescriptions   Signed Prescriptions Disp Refills  . atorvastatin (LIPITOR) 40 MG tablet 90 tablet 0    Sig: Take 1 tablet (40 mg total) by mouth daily.    Authorizing Provider: ARIDA, MUHAMMAD A    Ordering User: Phuoc Huy C    

## 2021-07-06 ENCOUNTER — Other Ambulatory Visit: Payer: Self-pay | Admitting: Cardiovascular Disease

## 2021-10-22 ENCOUNTER — Other Ambulatory Visit: Payer: Self-pay | Admitting: Nurse Practitioner

## 2021-10-22 ENCOUNTER — Other Ambulatory Visit: Payer: Self-pay

## 2021-10-22 NOTE — Telephone Encounter (Signed)
This is a [place] pt 

## 2021-10-22 NOTE — Telephone Encounter (Signed)
*  STAT* If patient is at the pharmacy, call can be transferred to refill team.   1. Which medications need to be refilled? (please list name of each medication and dose if known) Lipitor  2. Which pharmacy/location (including street and city if local pharmacy) is medication to be sent to? [facility] [facility] [address]  3. Do they need a 30 day or 90 day supply? 90

## 2021-10-22 NOTE — Telephone Encounter (Signed)
Please schedule 12 month F/U appointment. Thank you! 

## 2021-12-08 ENCOUNTER — Other Ambulatory Visit: Payer: Self-pay

## 2021-12-08 MED ORDER — AMLODIPINE BESYLATE 5 MG PO TABS: 5.0000 mg | ORAL_TABLET | Freq: Every day | ORAL | 0 refills | Status: DC

## 2021-12-08 NOTE — Telephone Encounter (Signed)
*  STAT* If patient is at the pharmacy, call can be transferred to refill team.   1. Which medications need to be refilled? (please list name of each medication and dose if known) Amlodipine  2. Which pharmacy/location (including street and city if local pharmacy) is medication to be sent to?[facility] [address]  3. Do they need a 30 day or 90 day supply? 90

## 2021-12-31 ENCOUNTER — Ambulatory Visit: Payer: Medicare Other | Admitting: Cardiovascular Disease

## 2022-01-23 ENCOUNTER — Other Ambulatory Visit: Payer: Self-pay | Admitting: Nurse Practitioner

## 2022-02-28 ENCOUNTER — Other Ambulatory Visit: Payer: Self-pay | Admitting: Nurse Practitioner

## 2022-03-01 ENCOUNTER — Telehealth: Payer: Self-pay | Admitting: Cardiovascular Disease

## 2022-03-01 MED ORDER — AMLODIPINE BESYLATE 5 MG PO TABS: 5.0000 mg | ORAL_TABLET | Freq: Every day | ORAL | 0 refills | Status: DC

## 2022-03-01 MED ORDER — METOPROLOL TARTRATE 25 MG PO TABS: 25.0000 mg | ORAL_TABLET | Freq: Two times a day (BID) | ORAL | 0 refills | Status: DC

## 2022-03-01 NOTE — Telephone Encounter (Signed)
Please advise for 90 day refill request. ?Pt hasn't been seen since 2021. ?Pt has been contacted and scheduled future appointment. ? ?

## 2022-03-01 NOTE — Telephone Encounter (Signed)
Please advise if ok to refill. ?Pt hasn't been seen since 10/2020. ?Pt does have future appointment scheduled 4/27 ? ?

## 2022-03-01 NOTE — Telephone Encounter (Signed)
Requested Prescriptions  ? ?Signed Prescriptions Disp Refills  ? amLODipine (NORVASC) 5 MG tablet 30 tablet 0  ?  Sig: Take 1 tablet (5 mg total) by mouth daily.  ?  Authorizing Provider: Murray Hodgkins RONALD  ?  Ordering User: Eugenio Hoes, [place] Dubose C  ? metoprolol tartrate (LOPRESSOR) 25 MG tablet 60 tablet 0  ?  Sig: Take 1 tablet (25 mg total) by mouth 2 (two) times daily.  ?  Authorizing Provider: Murray Hodgkins RONALD  ?  Ordering User: Othelia Pulling C  ? ? ?

## 2022-03-01 NOTE — Telephone Encounter (Signed)
?*  STAT* If patient is at the pharmacy, call can be transferred to refill team. ? ? ?1. Which medications need to be refilled? (please list name of each medication and dose if known) Amlodipine 5mg  1 tablet daily, Metoprolol Tartrate 25 mg 1 tablet daily ? ?2. Which pharmacy/location (including street and city if local pharmacy) is medication to be sent to? [facility] [place] ? ?3. Do they need a 30 day or 90 day supply? 90 day supply ?

## 2022-03-01 NOTE — Telephone Encounter (Signed)
Refill for #30. Patient can be granted longterm refill after appt is kept. ?

## 2022-03-31 ENCOUNTER — Other Ambulatory Visit: Payer: Self-pay

## 2022-03-31 NOTE — Telephone Encounter (Signed)
Please advise if ok to refill pt has hx of canceling appointments. ?Pt not scheduled until 06/2022. ?

## 2022-03-31 NOTE — Telephone Encounter (Signed)
*  STAT* If patient is at the pharmacy, call can be transferred to refill team.   1. Which medications need to be refilled? (please list name of each medication and dose if known) Metoprolol  2. Which pharmacy/location (including street and city if local pharmacy) is medication to be sent to? [facility] [address]  3. Do they need a 30 day or 90 day supply? 90 

## 2022-03-31 NOTE — Telephone Encounter (Signed)
Attempted to schedule appt but patient already scheduled . Patient can disregard the call.  ?

## 2022-04-01 ENCOUNTER — Ambulatory Visit: Payer: Medicare Other | Admitting: Cardiovascular Disease

## 2022-04-03 ENCOUNTER — Other Ambulatory Visit: Payer: Self-pay | Admitting: Nurse Practitioner

## 2022-04-05 ENCOUNTER — Other Ambulatory Visit: Payer: Self-pay

## 2022-04-05 ENCOUNTER — Telehealth: Payer: Self-pay | Admitting: Cardiovascular Disease

## 2022-04-05 MED ORDER — METOPROLOL TARTRATE 25 MG PO TABS: 25.0000 mg | ORAL_TABLET | Freq: Two times a day (BID) | ORAL | 3 refills | Status: DC

## 2022-04-05 MED ORDER — AMLODIPINE BESYLATE 5 MG PO TABS: 5.0000 mg | ORAL_TABLET | Freq: Every day | ORAL | 3 refills | Status: DC

## 2022-04-05 NOTE — Telephone Encounter (Signed)
Medication has been sent.  

## 2022-04-05 NOTE — Telephone Encounter (Signed)
Please advise if ok to refill pt hasn't been seen since 2021. ?Hx of canceling appointments. ?

## 2022-04-05 NOTE — Progress Notes (Signed)
? ?Cardiology Office Note   ? ?Date:  04/08/2022  ? ?ID:  Tyrone Blankenship, DOB 07-21-1951, MRN [id number] ? ?PCP:  Alan Mulder, MD  ?Cardiologist:  Lorine Bears, MD  ?Electrophysiologist:  None  ? ?Chief Complaint: Follow up ? ?History of Present Illness:  ? ?Tyrone Blankenship is a 71 y.o. male with history of CAD, DM2, HTN, HLD, hypothyroidism, hypogonadism, Raynaud's, and tobacco use who presents for follow up CAD.  ? ?He previously underwent LHC at [facility] in 2012, that showed mild 20% left main stenosis, 20% diffuse LAD stenosis, 20% LCx stenosis, and a subtotally occluded proximal RCA with left-to-right collaterals.  EF was normal.  In 2016, he was evaluated for chest pain with echo showing normal LVSF, mild LVH, moderate mitral regurgitation, and aortic valve sclerosis without stenosis. Treadmill stress test showed no evidence of ischemia or infarct with normal LVEF.  Abdominal aortic ultrasound in 2016 showed no evidence of AAA.  Carotid Dopplers in 2017 showed mild, nonobstructive disease.  ABIs in 2020 were normal.  He was last seen in the office in 10/2020 and was without angina or decompensation.  He had been less active due to a shingles episode.  ? ?He comes in doing well from a cardiac perspective and is without symptoms of angina or decompensation.  No palpitations, dizziness, presyncope, or syncope.  Throughout COVID, he was relatively sedentary and stopped his daily walking regimen.  He plans to resume this moving forward now that the weather is getting warmer.  He is tolerating cardiac medications without issues.  No significant lower extremity swelling.  Overall, he feels like he is doing well and does not have any cardiac issues or concerns at this time. ? ? ?Labs independently reviewed: ?Followed by PCP, we will contact their office for updated labs to be faxed. ? ?Past Medical History:  ?Diagnosis Date  ? CAD (coronary artery disease)   ? a. cath 10/20/2011: LM 20%, LAD diffuse 20%, LCx  diffuse 20%, sub totally occluded pRCA 90% extending to mRCA 99% w/ left to right collats, med Rx recommeded, EF 87%.   ? Claudication [facility])   ? a. 06/2019 ABI: R 1.18, L 1.15.  ? DM2 (diabetes mellitus, type 2) (HCC)   ? HLD (hyperlipidemia)   ? HTN (hypertension)   ? Hypogonadism in male   ? Hypogonadism male   ? Hypothyroidism   ? Mitral regurgitation   ? a. echo 02/2015: EF 55-60%, mild LVH, moderate MR, mild aortic sclerosis without stenosis, mild TR  ? Pernicious anemia   ? Tobacco abuse   ? ? ?Past Surgical History:  ?Procedure Laterality Date  ? CARDIAC CATHETERIZATION    ? [facility]  ? CARD[facility]AC CATHETERIZATION    ? [facility]  ? CHOLECYSTECTOMY    ? HAND TENDON SURGERY    ? ? ?Current Medications: ?Current Meds  ?Medication Sig  ? ALPRAZolam (XANAX) 0.5 MG tablet Take 0.5 mg by mouth at bedtime as needed for anxiety.  ? amLODipine (NORVASC) 5 MG tablet Take 1 tablet (5 mg total) by mouth daily.  ? aspirin 81 MG chewable tablet Chew 81 mg by mouth daily.  ? atorvastatin (LIPITOR) 40 MG tablet TAKE 1 TABLET(40 MG) BY MOUTH DAILY  ? augmented betamethasone dipropionate (DIPROLENE-AF) 0.05 % cream Apply topically daily.  ? cyanocobalamin (,VITAMIN B-12,) 1000 MCG/ML injection Inject 1,000 mcg into the muscle once.  ? furosemide (LASIX) 20 MG tablet Take by mouth daily.  ? gabapentin (NEURONTIN) 800 MG tablet Take  800 mg by mouth 3 (three) times daily.   ? metoprolol tartrate (LOPRESSOR) 25 MG tablet Take 1 tablet (25 mg total) by mouth 2 (two) times daily.  ? SYNTHROID 150 MCG tablet   ? Vitamin D, Ergocalciferol, (DRISDOL) 50000 UNITS CAPS capsule Take 50,000 Units by mouth every 7 (seven) days.  ? zolpidem (AMBIEN) 10 MG tablet Take 10 mg by mouth at bedtime.  ? [DISCONTINUED] nitroGLYCERIN (NITROSTAT) 0.4 MG SL tablet Place 1 tablet (0.4 mg total) under the tongue every 5 (five) minutes as needed for chest pain.  ? ? ?Allergies:   Morphine  ? ?Social History  ? ?Socioeconomic History  ? Marital status: Married  ?  Spouse  name: Not on file  ? Number of children: Not on file  ? Years of education: Not on file  ? Highest education level: Not on file  ?Occupational History  ? Not on file  ?Tobacco Use  ? Smoking status: Some Days  ?  Packs/day: 0.25  ?  Years: 40.00  ?  Pack years: 10.00  ?  Types: Cigarettes  ? Smokeless tobacco: Never  ?Substance and Sexual Activity  ? Alcohol use: No  ? Drug use: No  ? Sexual activity: Not on file  ?Other Topics Concern  ? Not on file  ?Social History Narrative  ? Not on file  ? ?Social Determinants of Health  ? ?Financial Resource Strain: Not on file  ?Food Insecurity: Not on file  ?Transportation Needs: Not on file  ?Physical Activity: Not on file  ?Stress: Not on file  ?Social Connections: Not on file  ?  ? ?Family History:  ?The patient's Family history is unknown by patient. ? ?ROS:   ?12 point review of systems is negative unless otherwise noted in the HPI. ? ? ?EKGs/Labs/Other Studies Reviewed:   ? ?Studies reviewed were summarized above. The additional studies were reviewed today: ?As above.  ? ?EKG:  EKG is ordered today.  The EKG ordered today demonstrates NSR, 73 bpm, no acute ST-T changes ? ?Recent Labs: ?No results found for requested labs within last 8760 hours.  ?Recent Lipid Panel ?   ?Component Value Date/Time  ? CHOL 119 03/06/2015 0708  ? TRIG 72 03/06/2015 0708  ? HDL 37 (L) 03/06/2015 0708  ? VLDL 14 03/06/2015 0708  ? LDLCALC 68 03/06/2015 0708  ? ? ?PHYSICAL EXAM:   ? ?VS:  BP 110/60 (BP Location: Left Arm, Patient Position: Sitting, Cuff Size: Normal)   Pulse 73   Ht 6\' 2"  (1.88 m)   Wt 216 lb 6 oz (98.1 kg)   SpO2 98%   BMI 27.78 kg/m?   BMI: Body mass index is 27.78 kg/m?. ? ?Physical Exam ?Vitals reviewed.  ?Constitutional:   ?   Appearance: He is well-developed.  ?HENT:  ?   Head: Normocephalic and atraumatic.  ?Eyes:  ?   General:     ?   Right eye: No discharge.     ?   Left eye: No discharge.  ?Neck:  ?   Vascular: No JVD.  ?Cardiovascular:  ?   Rate and Rhythm:  Normal rate and regular rhythm.  ?   Pulses:     ?     Posterior tibial pulses are 2+ on the right side and 2+ on the left side.  ?   Heart sounds: Normal heart sounds, S1 normal and S2 normal. Heart sounds not distant. No midsystolic click and no opening snap. No murmur heard. ?  No friction rub.  ?Pulmonary:  ?   Effort: Pulmonary effort is normal. No respiratory distress.  ?   Breath sounds: Normal breath sounds. No decreased breath sounds, wheezing or rales.  ?Chest:  ?   Chest wall: No tenderness.  ?Abdominal:  ?   General: There is no distension.  ?   Palpations: Abdomen is soft.  ?Musculoskeletal:  ?   Cervical back: Normal range of motion.  ?   Right lower leg: No edema.  ?   Left lower leg: No edema.  ?Skin: ?   General: Skin is warm and dry.  ?   Nails: There is no clubbing.  ?Neurological:  ?   Mental Status: He is alert and oriented to person, place, and time.  ?Psychiatric:     ?   Speech: Speech normal.     ?   Behavior: Behavior normal.     ?   Thought Content: Thought content normal.     ?   Judgment: Judgment normal.  ? ? ?Wt Readings from Last 3 Encounters:  ?04/08/22 216 lb 6 oz (98.1 kg)  ?10/15/20 207 lb (93.9 kg)  ?10/09/19 242 lb 8 oz (110 kg)  ?  ? ?ASSESSMENT & PLAN:  ? ?CAD involving the native coronary arteries without angina: He continues to do very well and is without symptoms concerning for angina.  Continue aggressive risk factor modification and current medical therapy including aspirin, atorvastatin, metoprolol, and amlodipine.  Encouraged to resume his walking regimen. ? ?HTN: Blood pressure is well controlled in the office today.  Continue current medical therapy. ? ?HLD: No recent LDL on file.  We will contact his PCP for updated labs to be faxed.  He remains on atorvastatin 40 mg. ? ? ?Disposition: F/u with Dr. Kirke CorinArida or an APP in 12 months. ? ? ?Medication Adjustments/Labs and Tests Ordered: ?Current medicines are reviewed at length with the patient today.  Concerns regarding  medicines are outlined above. Medication changes, Labs and Tests ordered today are summarized above and listed in the Patient Instructions accessible in Encounters.  ? ?Signed, ?Eula Listenyan [place], PA-C ?04/08/2022 4:08 PM

## 2022-04-05 NOTE — Telephone Encounter (Signed)
?*  STAT* If patient is at the pharmacy, call can be transferred to refill team. ? ? ?1. Which medications need to be refilled? (please list name of each medication and dose if known) Amlodipine 5mg  1 tablet daily, Metoprolol tartrate 25mg  1 tablet twice a day ? ?2. Which pharmacy/location (including street and city if local pharmacy) is medication to be sent to?[facility] [address], [place] ? ?3. Do they need a 30 day or 90 day supply? 90 day ?

## 2022-04-08 ENCOUNTER — Ambulatory Visit (INDEPENDENT_AMBULATORY_CARE_PROVIDER_SITE_OTHER): Payer: Medicare Other | Admitting: Physician Assistant

## 2022-04-08 ENCOUNTER — Encounter: Payer: Self-pay | Admitting: Physician Assistant

## 2022-04-08 VITALS — BP 110/60 | HR 73 | Ht 74.0 in | Wt 216.4 lb

## 2022-04-08 DIAGNOSIS — I2510 Atherosclerotic heart disease of native coronary artery without angina pectoris: Secondary | ICD-10-CM | POA: Diagnosis not present

## 2022-04-08 DIAGNOSIS — I10 Essential (primary) hypertension: Secondary | ICD-10-CM | POA: Diagnosis not present

## 2022-04-08 DIAGNOSIS — E785 Hyperlipidemia, unspecified: Secondary | ICD-10-CM | POA: Diagnosis not present

## 2022-04-08 MED ORDER — NITROGLYCERIN 0.4 MG SL SUBL: 0.4000 mg | SUBLINGUAL_TABLET | SUBLINGUAL | 0 refills | Status: DC | PRN

## 2022-04-08 NOTE — Patient Instructions (Signed)
Medication Instructions:  No changes at this time.   *If you need a refill on your cardiac medications before your next appointment, please call your pharmacy*   Lab Work: None  If you have labs (blood work) drawn today and your tests are completely normal, you will receive your results only by: MyChart Message (if you have MyChart) OR A paper copy in the mail If you have any lab test that is abnormal or we need to change your treatment, we will call you to review the results.   Testing/Procedures: None   Follow-Up: At [facility], you and your health needs are our priority.  As part of our continuing mission to provide you with exceptional heart care, we have created designated Provider Care Teams.  These Care Teams include your primary Cardiologist (physician) and Advanced Practice Providers (APPs -  Physician Assistants and Nurse Practitioners) who all work together to provide you with the care you need, when you need it.  We recommend signing up for the patient portal called "MyChart".  Sign up information is provided on this After Visit Summary.  MyChart is used to connect with patients for Virtual Visits (Telemedicine).  Patients are able to view lab/test results, encounter notes, upcoming appointments, etc.  Non-urgent messages can be sent to your provider as well.   To learn more about what you can do with MyChart, go to [email or web address].    Your next appointment:   1 year(s)  The format for your next appointment:   In Person  Provider:   Muhammad Arida, MD or Ryan Dunn, PA-C        Important Information About Sugar       

## 2022-04-29 ENCOUNTER — Other Ambulatory Visit: Payer: Self-pay | Admitting: Cardiovascular Disease

## 2022-06-24 IMAGING — CT CT ORBITS W/O CM
3 of 4 series · 15 of 47 positions shown, 18 images · non-contrast
Comparison: None.

CLINICAL DATA: Graves disease.  Proptosis.

EXAM:
CT ORBITS WITHOUT CONTRAST
TECHNIQUE: Multidetector CT images were obtained using the standard protocol
without intravenous contrast.

[Series 3: orbits soft orbits 2.00 · axial · 0.29mm/px · z∈[-532,-438]mm · 10 of 55 slices shown, 13 images]
[im 4/55  brain]
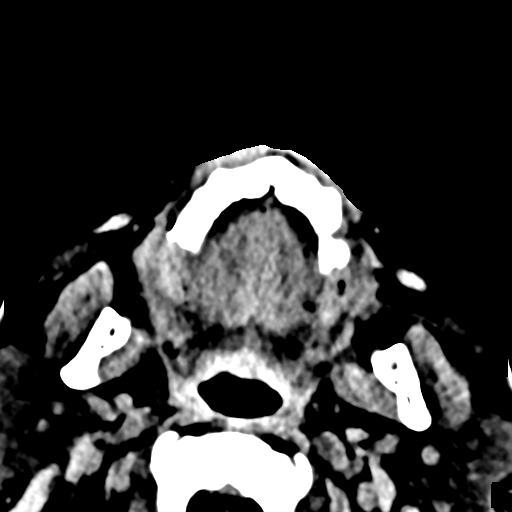
[im 4/55  bone]
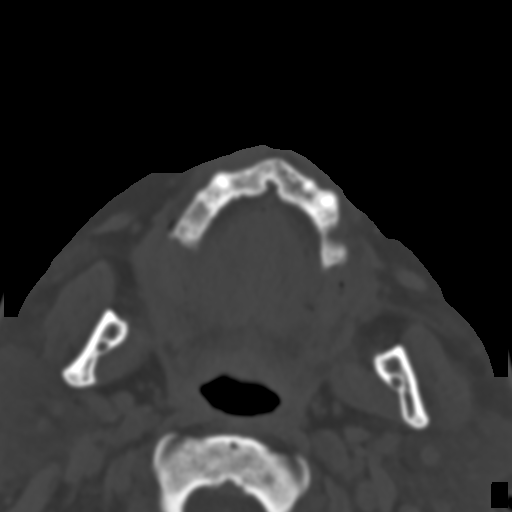
[im 10/55  bone]
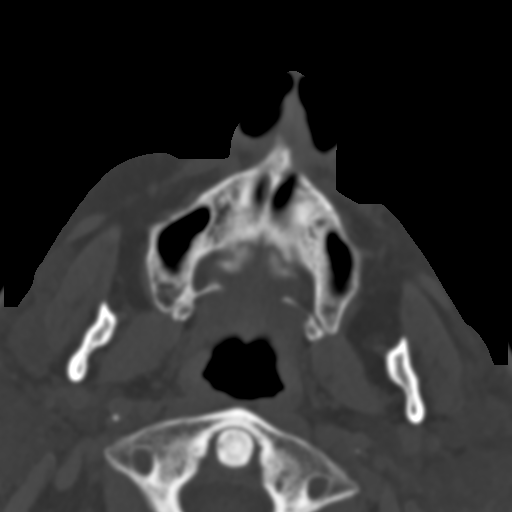
[im 15/55  bone]
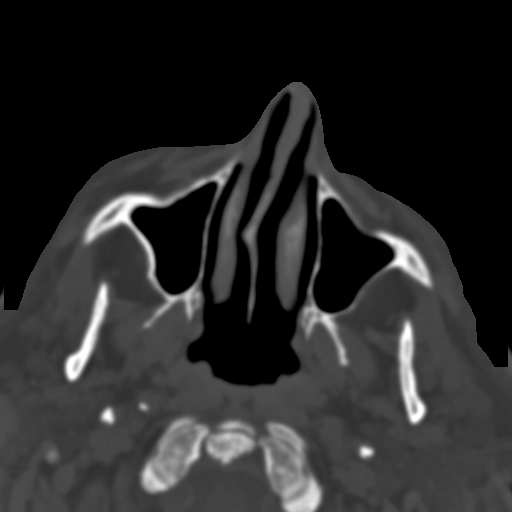
[im 19/55  bone]
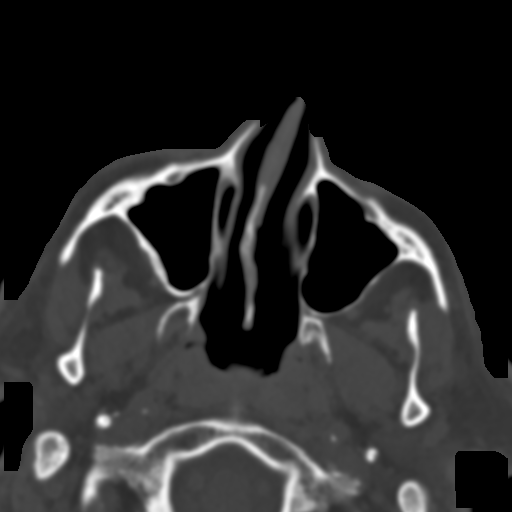
[im 25/55  brain]
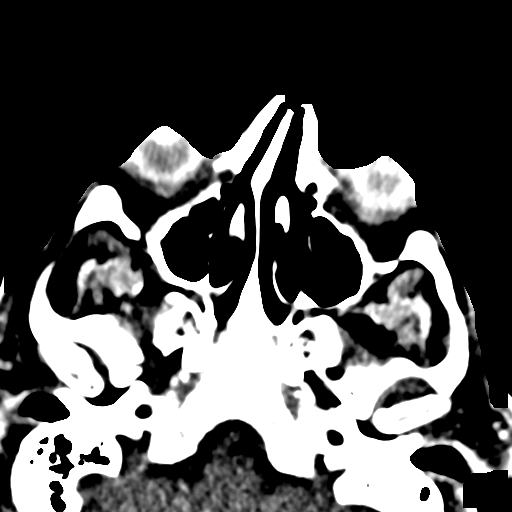
[im 25/55  bone]
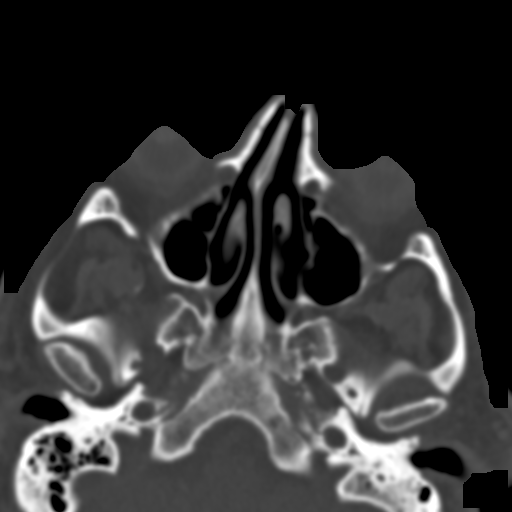
[im 30/55  bone]
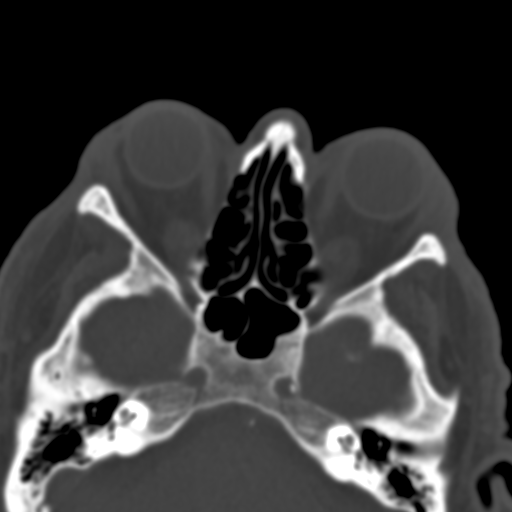
[im 36/55  bone]
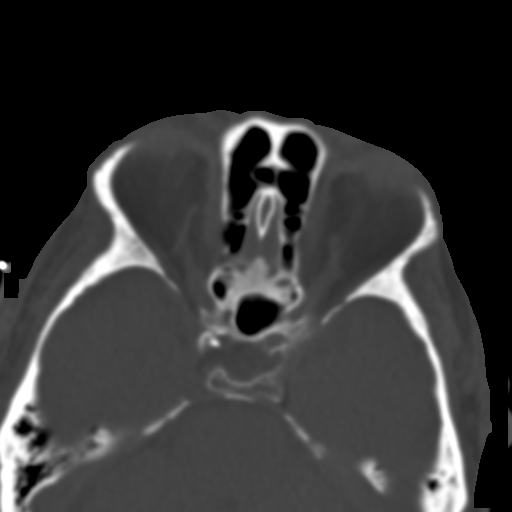
[im 41/55  bone]
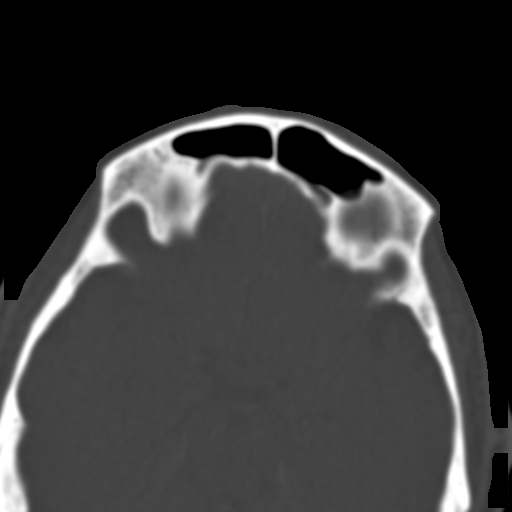
[im 45/55  brain]
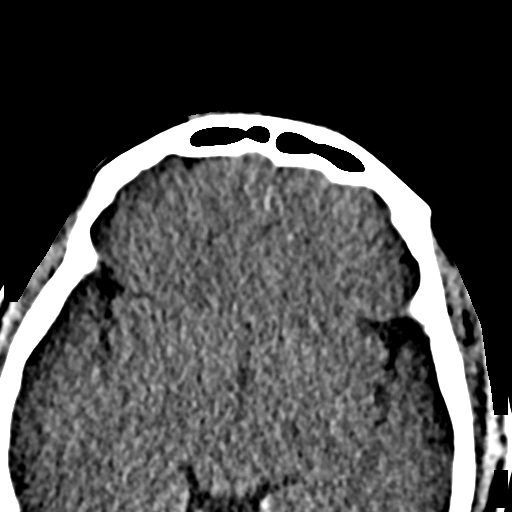
[im 45/55  bone]
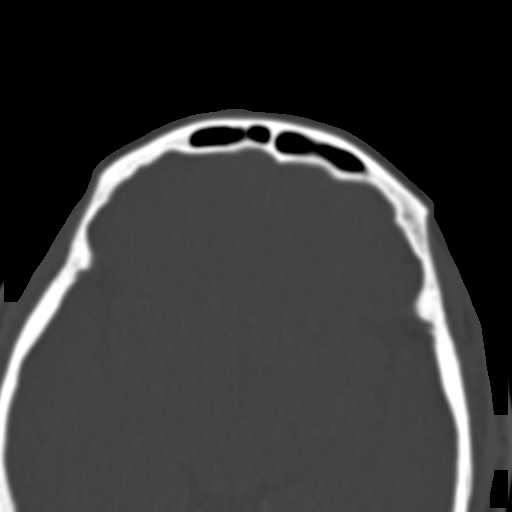
[im 51/55  bone]
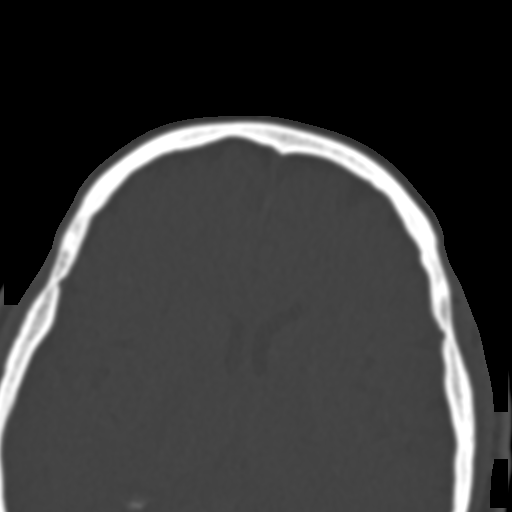

[Series 4: cor soft orbits 2.00 cor · coronal · 0.22mm/px · 3 of 61 slices shown]
[im 21/61  bone]
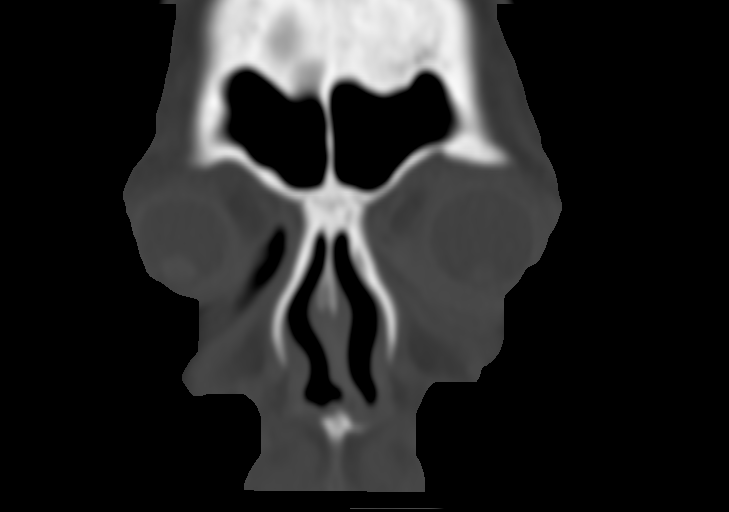
[im 27/61  bone]
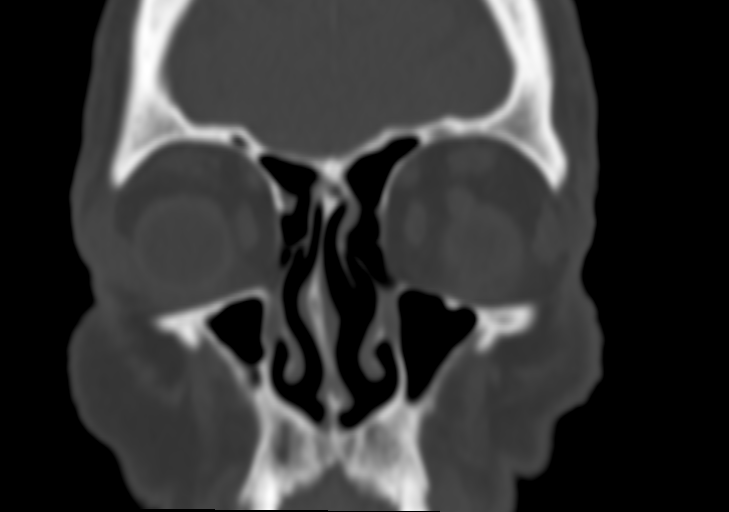
[im 34/61  bone]
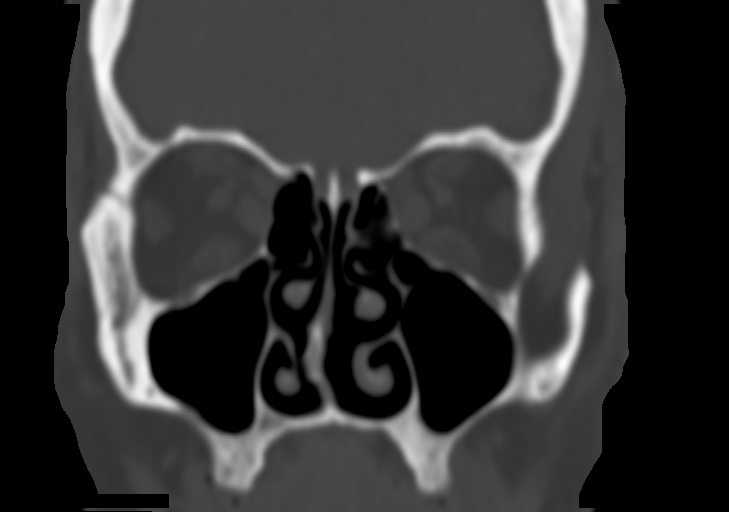

[Series 10: sag soft orbits 2.00 sag · sagittal · 0.22mm/px · 2 of 78 slices shown]
[im 26/78  bone]
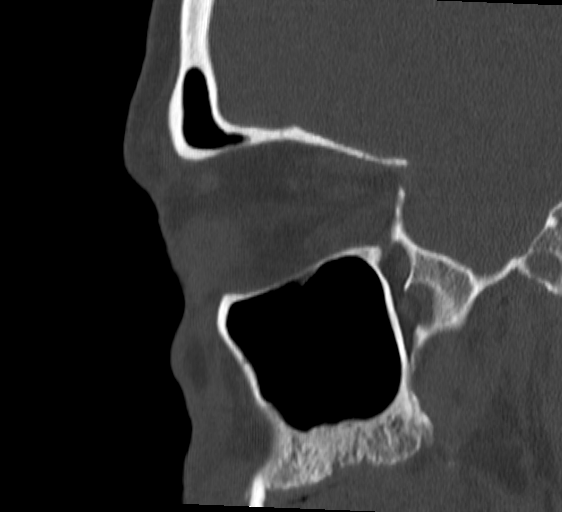
[im 52/78  bone]
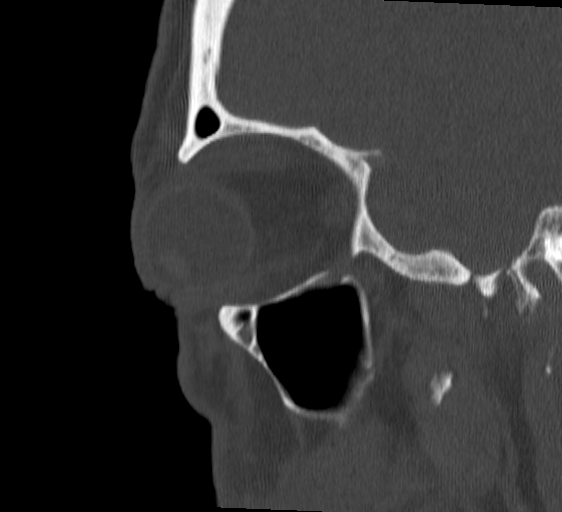

[15 of 47 positions shown; findings below may reference images not displayed]

FINDINGS: Orbits: Mild enlargement of the extra-ocular muscles with sparing of
the lateral rectus. The distance from the inter zygomatic line to
the anterior surface of the globes is 24 mm. The orbits are
otherwise unremarkable.

Visualized sinuses: Clear.

Soft tissues: Negative.

Limited intracranial: No significant or unexpected finding.
IMPRESSION: Mild enlargement of the extra-ocular muscles with sparing of the
lateral rectus, consistent with thyroid orbitopathy. Slight
proptosis.

## 2022-06-29 ENCOUNTER — Ambulatory Visit: Payer: Medicare Other | Admitting: Cardiovascular Disease

## 2023-01-01 ENCOUNTER — Other Ambulatory Visit: Payer: Self-pay | Admitting: Nurse Practitioner

## 2023-01-10 ENCOUNTER — Other Ambulatory Visit: Payer: Self-pay

## 2023-01-10 MED ORDER — AMLODIPINE BESYLATE 5 MG PO TABS: 5.0000 mg | ORAL_TABLET | Freq: Every day | ORAL | 2 refills | Status: DC

## 2023-01-10 MED ORDER — ATORVASTATIN CALCIUM 40 MG PO TABS: ORAL_TABLET | ORAL | 2 refills | Status: DC

## 2023-02-02 ENCOUNTER — Telehealth: Payer: Self-pay | Admitting: Cardiovascular Disease

## 2023-02-02 MED ORDER — ATORVASTATIN CALCIUM 40 MG PO TABS: ORAL_TABLET | ORAL | 0 refills | Status: DC

## 2023-02-02 NOTE — Telephone Encounter (Signed)
Requested Prescriptions   Signed Prescriptions Disp Refills   atorvastatin (LIPITOR) 40 MG tablet 90 tablet 0    Sig: TAKE 1 TABLET(40 MG) BY MOUTH DAILY    Authorizing Provider: Kathlyn Sacramento A    Ordering User: Britt Bottom

## 2023-02-02 NOTE — Telephone Encounter (Signed)
*  STAT* If patient is at the pharmacy, call can be transferred to refill team.   1. Which medications need to be refilled? (please list name of each medication and dose if known)  atorvastatin (LIPITOR) 40 MG tablet   2. Which pharmacy/location (including street and city if local pharmacy) is medication to be sent to? [facility] [place], [place]   3. Do they need a 30 day or 90 day supply? 90 day

## 2023-04-04 ENCOUNTER — Telehealth: Payer: Self-pay | Admitting: Cardiovascular Disease

## 2023-04-04 ENCOUNTER — Other Ambulatory Visit: Payer: Self-pay | Admitting: Cardiovascular Disease

## 2023-04-04 MED ORDER — METOPROLOL TARTRATE 25 MG PO TABS: ORAL_TABLET | ORAL | 0 refills | Status: DC

## 2023-04-04 NOTE — Telephone Encounter (Signed)
Requested Prescriptions   Signed Prescriptions Disp Refills   metoprolol tartrate (LOPRESSOR) 25 MG tablet 60 tablet 0    Sig: TAKE 1 TABLET(25 MG) BY MOUTH TWICE DAILY    Authorizing Provider: Lorine Bears A    Ordering User: NEWCOMER MCCLAIN, Dietrick Barris L   Did not give 90 day supply d/t hx of cancellations. Will give further refills at upcoming office visit.

## 2023-04-04 NOTE — Telephone Encounter (Signed)
*  STAT* If patient is at the pharmacy, call can be transferred to refill team.   1. Which medications need to be refilled? (please list name of each medication and dose if known)   metoprolol tartrate (LOPRESSOR) 25 MG tablet    2. Which pharmacy/location (including street and city if local pharmacy) is medication to be sent to?  [id number] - [place], [place] - [address]  3. Do they need a 30 day or 90 day supply? 90 day   Pt has scheduled appt on 04/19/23

## 2023-04-18 NOTE — Progress Notes (Signed)
Cardiology Office Note    Date:  04/19/2023   ID:  Jiles, Jacox 26-Oct-1951, MRN [id number]  PCP:  Alan Mulder, MD  Cardiologist:  Lorine Bears, MD  Electrophysiologist:  None   Chief Complaint: Follow-up  History of Present Illness:   Tyrone Blankenship is a 72 y.o. male with history of CAD, DM2, HTN, HLD, hypothyroidism, hypogonadism, Raynaud's, and tobacco use who presents for follow up CAD.    He previously underwent LHC at [facility] in 2012, that showed mild 20% left main stenosis, 20% diffuse LAD stenosis, 20% LCx stenosis, and a subtotally occluded proximal RCA with left-to-right collaterals.  EF was normal.  In 2016, he was evaluated for chest pain with echo showing normal LVSF, mild LVH, moderate mitral regurgitation, and aortic valve sclerosis without stenosis. Treadmill stress test showed no evidence of ischemia or infarct with normal LVEF.  Abdominal aortic ultrasound in 2016 showed no evidence of AAA.  Carotid Dopplers in 2017 showed mild, nonobstructive disease.  ABIs in 2020 were normal.  He was last seen in the office in 04/2022 and remained without symptoms of angina or cardiac decompensation.  Throughout COVID, he was more sedentary and had stopped his daily walking.  No changes were indicated at that time.  He comes in today and is doing well from a cardiac perspective, without symptoms of angina or cardiac decompensation.  No dyspnea, palpitations, dizziness, presyncope, or syncope.  His weight is down 34 pounds when compared to his last clinic visit.  He reports he is under increased stress since involving the health of his wife who has what sounds like end-stage Parkinson's disease.  Hospice is becoming involved.  Given this, he reports a diminished appetite.  He reports his PCP is aware of his weight loss and is following this.  He also reports his PCP is under take an echo and carotid artery ultrasound with results being unrevealing per his report.  We do not have  these available for review at this time.  He is adherent and tolerating cardiac medications without issues.  He is now on low-dose furosemide secondary to lower extremity swelling.  He reports some issues with seasonal allergies as well.  He does not have any acute cardiac concerns at this time and feels like he is doing well.   Labs independently reviewed: Follow up by PCP.    Past Medical History:  Diagnosis Date   CAD (coronary artery disease)    a. cath 10/20/2011: LM 20%, LAD diffuse 20%, LCx diffuse 20%, sub totally occluded pRCA 90% extending to mRCA 99% w/ left to right collats, med Rx recommeded, EF 87%.    Claudication (HCC)    a. 06/2019 ABI: R 1.18, L 1.15.   DM2 (diabetes mellitus, type 2) (HCC)    HLD (hyperlipidemia)    HTN (hypertension)    Hypogonadism in male    Hypogonadism male    Hypothyroidism    Mitral regurgitation    a. echo 02/2015: EF 55-60%, mild LVH, moderate MR, mild aortic sclerosis without stenosis, mild TR   Pernicious anemia    Tobacco abuse     Past Surgical History:  Procedure Laterality Date   CARD[facility]AC CATHETERIZATION     [facility]   [facility]ATION     [facility]   CHOLECYSTECTOMY     HAND TENDON SURGERY      Current Medications: Current Meds  Medication Sig   ALPRAZolam (XANAX) 0.5 MG tablet Take 0.5 mg by mouth at  bedtime as needed for anxiety.   amLODipine (NORVASC) 5 MG tablet Take 1 tablet (5 mg total) by mouth daily.   aspirin 81 MG chewable tablet Chew 81 mg by mouth daily.   atorvastatin (LIPITOR) 40 MG tablet TAKE 1 TABLET(40 MG) BY MOUTH DAILY   furosemide (LASIX) 20 MG tablet Take by mouth daily.   hydroxychloroquine (PLAQUENIL) 200 MG tablet Take 200 mg by mouth 2 (two) times daily.   metoprolol tartrate (LOPRESSOR) 25 MG tablet TAKE 1 TABLET(25 MG) BY MOUTH TWICE DAILY   SYNTHROID 150 MCG tablet Take 150 mcg by mouth daily before breakfast.   Vitamin D, Ergocalciferol, (DRISDOL) 50000 UNITS CAPS capsule Take 50,000 Units by  mouth every 7 (seven) days.   zolpidem (AMBIEN) 10 MG tablet Take 10 mg by mouth at bedtime.    Allergies:   Morphine   Social History   Socioeconomic History   Marital status: Married    Spouse name: Not on file   Number of children: Not on file   Years of education: Not on file   Highest education level: Not on file  Occupational History   Not on file  Tobacco Use   Smoking status: Some Days    Packs/day: 0.25    Years: 40.00    Additional pack years: 0.00    Total pack years: 10.00    Types: Cigarettes   Smokeless tobacco: Never  Substance and Sexual Activity   Alcohol use: No   Drug use: No   Sexual activity: Not on file  Other Topics Concern   Not on file  Social History Narrative   Not on file   Social Determinants of Health   Financial Resource Strain: Not on file  Food Insecurity: Not on file  Transportation Needs: Not on file  Physical Activity: Not on file  Stress: Not on file  Social Connections: Not on file     Family History:  The patient's Family history is unknown by patient.  ROS:   12-point review of systems is negative unless otherwise noted in the HPI.   EKGs/Labs/Other Studies Reviewed:    Studies reviewed were summarized above. The additional studies were reviewed today: As above.   EKG:  EKG is ordered today.  The EKG ordered today demonstrates NSR, 61 bpm, no acute ST-T changes  Recent Labs: No results found for requested labs within last 365 days.  Recent Lipid Panel    Component Value Date/Time   CHOL 119 03/06/2015 0708   TRIG 72 03/06/2015 0708   HDL 37 (L) 03/06/2015 0708   VLDL 14 03/06/2015 0708   LDLCALC 68 03/06/2015 0708    PHYSICAL EXAM:    VS:  BP (!) 116/50 (BP Location: Left Arm, Patient Position: Sitting, Cuff Size: Normal)   Pulse 61   Ht 6\' 2"  (1.88 m)   Wt 182 lb 12.8 oz (82.9 kg)   SpO2 95%   BMI 23.47 kg/m   BMI: Body mass index is 23.47 kg/m.  Physical Exam Vitals reviewed.  Constitutional:       Appearance: He is well-developed.  HENT:     Head: Normocephalic and atraumatic.  Eyes:     General:        Right eye: No discharge.        Left eye: No discharge.  Neck:     Vascular: No JVD.  Cardiovascular:     Rate and Rhythm: Normal rate and regular rhythm.     Heart sounds: Normal heart  sounds, S1 normal and S2 normal. Heart sounds not distant. No midsystolic click and no opening snap. No murmur heard.    No friction rub.  Pulmonary:     Effort: Pulmonary effort is normal. No respiratory distress.     Breath sounds: Normal breath sounds. No decreased breath sounds, wheezing or rales.  Chest:     Chest wall: No tenderness.  Abdominal:     General: There is no distension.  Musculoskeletal:     Cervical back: Normal range of motion.  Skin:    General: Skin is warm and dry.     Nails: There is no clubbing.  Neurological:     Mental Status: He is alert and oriented to person, place, and time.  Psychiatric:        Speech: Speech normal.        Behavior: Behavior normal.        Thought Content: Thought content normal.        Judgment: Judgment normal.     Wt Readings from Last 3 Encounters:  04/19/23 182 lb 12.8 oz (82.9 kg)  04/08/22 216 lb 6 oz (98.1 kg)  10/15/20 207 lb (93.9 kg)     ASSESSMENT & PLAN:   CAD involving the native coronary arteries without angina: He is without symptoms concerning for angina or cardiac decompensation.  Continue aggressive risk factor modification and current medical therapy including aspirin, amlodipine, atorvastatin, and metoprolol tartrate.  No indication for further ischemic testing at this time.  We will attempt to obtain recent echo performed by PCP.  HTN: Blood pressure is well-controlled in the office today.  He remains on amlodipine and metoprolol.  HLD: Followed by PCP.  He remains on atorvastatin.   Disposition: F/u with Dr. Kirke Corin or an APP in 12 months.   Medication Adjustments/Labs and Tests Ordered: Current  medicines are reviewed at length with the patient today.  Concerns regarding medicines are outlined above. Medication changes, Labs and Tests ordered today are summarized above and listed in the Patient Instructions accessible in Encounters.   Signed, Eula Listen, PA-C 04/19/2023 2:47 PM     [facility] [place] [address] [place], [place] [address] [phone]

## 2023-04-19 ENCOUNTER — Encounter: Payer: Self-pay | Admitting: Physician Assistant

## 2023-04-19 ENCOUNTER — Ambulatory Visit: Payer: Medicare Other | Attending: Physician Assistant | Admitting: Physician Assistant

## 2023-04-19 VITALS — BP 116/50 | HR 61 | Ht 74.0 in | Wt 182.8 lb

## 2023-04-19 DIAGNOSIS — E785 Hyperlipidemia, unspecified: Secondary | ICD-10-CM | POA: Diagnosis present

## 2023-04-19 DIAGNOSIS — I2510 Atherosclerotic heart disease of native coronary artery without angina pectoris: Secondary | ICD-10-CM | POA: Diagnosis present

## 2023-04-19 DIAGNOSIS — I10 Essential (primary) hypertension: Secondary | ICD-10-CM | POA: Diagnosis present

## 2023-04-19 NOTE — Patient Instructions (Signed)
Medication Instructions:  No changes at this time.   *If you need a refill on your cardiac medications before your next appointment, please call your pharmacy*   Lab Work: None  If you have labs (blood work) drawn today and your tests are completely normal, you will receive your results only by: MyChart Message (if you have MyChart) OR A paper copy in the mail If you have any lab test that is abnormal or we need to change your treatment, we will call you to review the results.   Testing/Procedures: None   Follow-Up: At [facility], you and your health needs are our priority.  As part of our continuing mission to provide you with exceptional heart care, we have created designated Provider Care Teams.  These Care Teams include your primary Cardiologist (physician) and Advanced Practice Providers (APPs -  Physician Assistants and Nurse Practitioners) who all work together to provide you with the care you need, when you need it.  We recommend signing up for the patient portal called "MyChart".  Sign up information is provided on this After Visit Summary.  MyChart is used to connect with patients for Virtual Visits (Telemedicine).  Patients are able to view lab/test results, encounter notes, upcoming appointments, etc.  Non-urgent messages can be sent to your provider as well.   To learn more about what you can do with MyChart, go to [email or web address].    Your next appointment:   1 year(s)  Provider:   Lorine Bears, MD or Eula Listen, PA-C

## 2023-05-09 ENCOUNTER — Telehealth: Payer: Self-pay | Admitting: Cardiovascular Disease

## 2023-05-09 MED ORDER — METOPROLOL TARTRATE 25 MG PO TABS: ORAL_TABLET | ORAL | 3 refills | Status: DC

## 2023-05-09 NOTE — Telephone Encounter (Signed)
Requested Prescriptions   Signed Prescriptions Disp Refills   metoprolol tartrate (LOPRESSOR) 25 MG tablet 180 tablet 3    Sig: TAKE 1 TABLET(25 MG) BY MOUTH TWICE DAILY    Authorizing Provider: Lorine Bears A    Ordering User: Kendrick Fries

## 2023-05-09 NOTE — Telephone Encounter (Signed)
*  STAT* If patient is at the pharmacy, call can be transferred to refill team.   1. Which medications need to be refilled? (please list name of each medication and dose if known) metoprolol tartrate (LOPRESSOR) 25 MG tablet  2. Which pharmacy/location (including street and city if local pharmacy) is medication to be sent to? [id number] - [place], [place] - [address]  3. Do they need a 30 day or 90 day supply?  90 day supply  Patient requesting that we make sure to send in a 90 day supply and not 90 tablets. He states he takes the medication twice daily.

## 2023-09-21 ENCOUNTER — Other Ambulatory Visit: Payer: Self-pay | Admitting: Cardiovascular Disease

## 2023-10-08 ENCOUNTER — Other Ambulatory Visit: Payer: Self-pay | Admitting: Cardiovascular Disease

## 2024-02-15 ENCOUNTER — Other Ambulatory Visit: Payer: Self-pay | Admitting: Cardiovascular Disease

## 2024-04-29 ENCOUNTER — Other Ambulatory Visit: Payer: Self-pay | Admitting: Cardiovascular Disease

## 2024-05-12 ENCOUNTER — Other Ambulatory Visit: Payer: Self-pay | Admitting: Cardiovascular Disease

## 2024-05-17 NOTE — Progress Notes (Signed)
 Cardiology Office Note    Date:  05/18/2024   ID:  Santez, Woodcox March 30, 1951, MRN [id number]  PCP:  Giovanna Lake, MD  Cardiologist:  Antionette Kirks, MD  Electrophysiologist:  None   Chief Complaint: Follow up  History of Present Illness:   Tyrone Blankenship is a 73 y.o. male with history of CAD, DM2, HTN, HLD, hypothyroidism, hypogonadism, Raynaud's, and tobacco use who presents for follow up CAD.    He previously underwent LHC at [facility] in 2012, that showed mild 20% left main stenosis, 20% diffuse LAD stenosis, 20% LCx stenosis, and a subtotally occluded proximal RCA with left-to-right collaterals.  EF was normal.  In 2016, he was evaluated for chest pain with echo showing normal LVSF, mild LVH, moderate mitral regurgitation, and aortic valve sclerosis without stenosis. Treadmill stress test showed no evidence of ischemia or infarct with normal LVEF.  Abdominal aortic ultrasound in 2016 showed no evidence of AAA.  Carotid Dopplers in 2017 showed mild, nonobstructive disease.  ABIs in 2020 were normal.  Throughout the Covid pandemic, he was more sedentary and had stopped his daily walking.  He was last seen in the office in 04/2023 and was doing well from a cardiac perspective.  His weight was down 34 pounds when compared to his visit in 04/2022.  He was under increased stress involving the health of his wife who was undergoing end-of-life care.  In this setting he reported a diminished appetite.  No changes were indicated in cardiac pharmacotherapy at that time.  He comes in doing well from a cardiac perspective and is without symptoms of angina or cardiac decompensation.  He does report a rare brief split-second lasting episode of sharp chest discomfort that will randomly occur and is not associated with exertion.  No dyspnea, palpitations, dizziness, presyncope, or syncope.  He remains more sedentary in the setting of caregiving for his wife.  In this setting, he has put back on  approximately 20 pounds of the 34 pounds he has lost last year.  He does not feel volume overloaded.  No progressive orthopnea.  No falls or symptoms concerning for bleeding.   Labs independently reviewed: 03/2024 - Hgb 11.9, PLT 229 BUN 30, serum creatinine 1.09, AST/ALT normal 10/2023 - TSH normal 05/2023 -TC 112, TG 50, HDL 58, LDL 42  Past Medical History:  Diagnosis Date   CAD (coronary artery disease)    a. cath 10/20/2011: LM 20%, LAD diffuse 20%, LCx diffuse 20%, sub totally occluded pRCA 90% extending to mRCA 99% w/ left to right collats, med Rx recommeded, EF 87%.    Claudication (HCC)    a. 06/2019 ABI: R 1.18, L 1.15.   DM2 (diabetes mellitus, type 2) (HCC)    HLD (hyperlipidemia)    HTN (hypertension)    Hypogonadism in male    Hypogonadism male    Hypothyroidism    Mitral regurgitation    a. echo 02/2015: EF 55-60%, mild LVH, moderate MR, mild aortic sclerosis without stenosis, mild TR   Pernicious anemia    Tobacco abuse     Past Surgical History:  Procedure Laterality Date   CARDIAC CATHETERIZATION     [facility]   CARD[facility]AC CATHETERIZATION     [facility]   CHOLECYSTECTOMY     HAND TENDON SURGERY      Current Medications: Current Meds  Medication Sig   ALPRAZolam (XANAX) 0.5 MG tablet Take 0.5 mg by mouth at bedtime as needed for anxiety.   amLODipine  (NORVASC )  5 MG tablet TAKE 1 TABLET EVERY DAY   aspirin 81 MG chewable tablet Chew 81 mg by mouth daily.   atorvastatin  (LIPITOR) 40 MG tablet Take 1 tablet (40 mg total) by mouth daily. Please keep upcoming appointment for future refills   augmented betamethasone dipropionate (DIPROLENE-AF) 0.05 % cream Apply topically daily.   hydroxychloroquine (PLAQUENIL) 200 MG tablet Take 200 mg by mouth 2 (two) times daily.   metoprolol  tartrate (LOPRESSOR ) 25 MG tablet TAKE 1 TABLET(25 MG) BY MOUTH TWICE DAILY PLEASE KEEP UPCOMING APPOINTMENT IN ORDER TO RECEIVE FUTURE REFILLS. THANK YOU!   SYNTHROID 150 MCG tablet Take 150 mcg by  mouth daily before breakfast.   Vitamin D, Ergocalciferol, (DRISDOL) 50000 UNITS CAPS capsule Take 50,000 Units by mouth every 7 (seven) days.   zolpidem (AMBIEN) 10 MG tablet Take 10 mg by mouth at bedtime.   [DISCONTINUED] nitroGLYCERIN  (NITROSTAT ) 0.4 MG SL tablet Place 1 tablet (0.4 mg total) under the tongue every 5 (five) minutes as needed for chest pain.    Allergies:   Morphine   Social History   Socioeconomic History   Marital status: Married    Spouse name: Not on file   Number of children: Not on file   Years of education: Not on file   Highest education level: Not on file  Occupational History   Not on file  Tobacco Use   Smoking status: Some Days    Current packs/day: 0.25    Average packs/day: 0.3 packs/day for 40.0 years (10.0 ttl pk-yrs)    Types: Cigarettes   Smokeless tobacco: Never  Substance and Sexual Activity   Alcohol use: No   Drug use: No   Sexual activity: Not on file  Other Topics Concern   Not on file  Social History Narrative   Not on file   Social Drivers of Health   Financial Resource Strain: Not on file  Food Insecurity: Not on file  Transportation Needs: Not on file  Physical Activity: Not on file  Stress: Not on file  Social Connections: Not on file     Family History:  The patient's Family history is unknown by patient.  ROS:   12-point review of systems is negative unless otherwise noted in the HPI.   EKGs/Labs/Other Studies Reviewed:    Studies reviewed were summarized above. The additional studies were reviewed today: As above.   EKG:  EKG is ordered today.  The EKG ordered today demonstrates NSR, 62 bpm, septal infarct vs lead placement   Recent Labs: No results found for requested labs within last 365 days.  Recent Lipid Panel    Component Value Date/Time   CHOL 119 03/06/2015 0708   TRIG 72 03/06/2015 0708   HDL 37 (L) 03/06/2015 0708   VLDL 14 03/06/2015 0708   LDLCALC 68 03/06/2015 0708    PHYSICAL EXAM:     VS:  BP (!) 140/62 (BP Location: Left Arm, Patient Position: Sitting, Cuff Size: Normal)   Pulse 62   Ht 6' 2 (1.88 m)   Wt 201 lb (91.2 kg)   SpO2 97%   BMI 25.81 kg/m   BMI: Body mass index is 25.81 kg/m.  Physical Exam Vitals reviewed.  Constitutional:      Appearance: He is well-developed.  HENT:     Head: Normocephalic and atraumatic.   Eyes:     General:        Right eye: No discharge.        Left eye: No  discharge.    Cardiovascular:     Rate and Rhythm: Normal rate and regular rhythm.     Heart sounds: Normal heart sounds, S1 normal and S2 normal. Heart sounds not distant. No midsystolic click and no opening snap. No murmur heard.    No friction rub.  Pulmonary:     Effort: Pulmonary effort is normal. No respiratory distress.     Breath sounds: Normal breath sounds. No decreased breath sounds, wheezing, rhonchi or rales.  Chest:     Chest wall: No tenderness.   Musculoskeletal:     Cervical back: Normal range of motion.   Skin:    General: Skin is warm and dry.     Nails: There is no clubbing.   Neurological:     Mental Status: He is alert and oriented to person, place, and time.   Psychiatric:        Speech: Speech normal.        Behavior: Behavior normal.        Thought Content: Thought content normal.        Judgment: Judgment normal.     Wt Readings from Last 3 Encounters:  05/18/24 201 lb (91.2 kg)  04/19/23 182 lb 12.8 oz (82.9 kg)  04/08/22 216 lb 6 oz (98.1 kg)     ASSESSMENT & PLAN:   CAD involving native coronary arteries without angina: He comes in doing well and is without symptoms concerning for angina or cardiac decompensation.  Continue aggressive risk factor modification and current medical therapy including aspirin 81 mg, amlodipine  5 mg, atorvastatin  40 mg, and Lopressor  25 mg twice daily.  He has not needed any as needed SL NTG.  No indication for further ischemic testing at this time.  HTN: Blood pressure is mildly elevated  in the office today, though typically well-controlled.  He remains on amlodipine  and metoprolol  as outlined above.  He will continue to monitor blood pressure and notify us  if BP readings are on the high side.  HLD: LDL 42 in 05/2023.  He remains on atorvastatin  40 mg.  Check lipid panel and direct LDL.     Disposition: F/u with Dr. Alvenia Aus or an APP in 12 months.   Medication Adjustments/Labs and Tests Ordered: Current medicines are reviewed at length with the patient today.  Concerns regarding medicines are outlined above. Medication changes, Labs and Tests ordered today are summarized above and listed in the Patient Instructions accessible in Encounters.   Signed, Varney Gentleman, PA-C 05/18/2024 4:18 PM     [facility] [place] [address] [place], [place] [address] [phone]

## 2024-05-18 ENCOUNTER — Encounter: Payer: Self-pay | Admitting: Physician Assistant

## 2024-05-18 ENCOUNTER — Ambulatory Visit: Attending: Physician Assistant | Admitting: Physician Assistant

## 2024-05-18 VITALS — BP 140/62 | HR 62 | Ht 74.0 in | Wt 201.0 lb

## 2024-05-18 DIAGNOSIS — I10 Essential (primary) hypertension: Secondary | ICD-10-CM | POA: Insufficient documentation

## 2024-05-18 DIAGNOSIS — E785 Hyperlipidemia, unspecified: Secondary | ICD-10-CM | POA: Diagnosis present

## 2024-05-18 DIAGNOSIS — I2510 Atherosclerotic heart disease of native coronary artery without angina pectoris: Secondary | ICD-10-CM | POA: Diagnosis present

## 2024-05-18 DIAGNOSIS — Z79899 Other long term (current) drug therapy: Secondary | ICD-10-CM | POA: Diagnosis present

## 2024-05-18 NOTE — Patient Instructions (Signed)
 Medication Instructions:  Your physician recommends that you continue on your current medications as directed. Please refer to the Current Medication list given to you today.   *If you need a refill on your cardiac medications before your next appointment, please call your pharmacy*  Lab Work: Your provider would like for you to have following labs drawn today Lipids and Direct LDL.   If you have labs (blood work) drawn today and your tests are completely normal, you will receive your results only by: MyChart Message (if you have MyChart) OR A paper copy in the mail If you have any lab test that is abnormal or we need to change your treatment, we will call you to review the results.  Follow-Up: At [facility], you and your health needs are our priority.  As part of our continuing mission to provide you with exceptional heart care, our providers are all part of one team.  This team includes your primary Cardiologist (physician) and Advanced Practice Providers or APPs (Physician Assistants and Nurse Practitioners) who all work together to provide you with the care you need, when you need it.  Your next appointment:   12 month(s)  Provider:   You may see Antionette Kirks, MD or Varney Gentleman, PA-C  We recommend signing up for the patient portal called MyChart.  Sign up information is provided on this After Visit Summary.  MyChart is used to connect with patients for Virtual Visits (Telemedicine).  Patients are able to view lab/test results, encounter notes, upcoming appointments, etc.  Non-urgent messages can be sent to your provider as well.   To learn more about what you can do with MyChart, go to [email or web address].

## 2024-05-19 LAB — LIPID PANEL
Chol/HDL Ratio: 2.3 ratio (ref 0.0–5.0)
Cholesterol, Total: 111 mg/dL (ref 100–199)
HDL: 49 mg/dL (ref >39)
LDL Chol Calc (NIH): 46 mg/dL (ref 0–99)
Triglycerides: 79 mg/dL (ref 0–149)
VLDL Cholesterol Cal: 16 mg/dL (ref 5–40)

## 2024-05-19 LAB — LDL CHOLESTEROL, DIRECT: LDL Direct: 47 mg/dL (ref 0–99)

## 2024-05-21 ENCOUNTER — Ambulatory Visit: Payer: Self-pay | Admitting: Physician Assistant

## 2024-05-23 ENCOUNTER — Telehealth: Payer: Self-pay | Admitting: Cardiovascular Disease

## 2024-05-23 MED ORDER — METOPROLOL TARTRATE 25 MG PO TABS: ORAL_TABLET | ORAL | 3 refills | Status: AC

## 2024-05-23 NOTE — Telephone Encounter (Signed)
*  STAT* If patient is at the pharmacy, call can be transferred to refill team.   1. Which medications need to be refilled? (please list name of each medication and dose if known) Metoprolol    2. Would you like to learn more about the convenience, safety, & potential cost savings by using the [facility]?     3. Are you open to using the [facility] (Type [facility].    4. Which pharmacy/location (including street and city if local pharmacy) is medication to be sent to?  [facility] [employer] [facility] [place]   5. Do they need a 30 day or 90 day supply? 90 days # 180 and refills- please call in today- out of medicine

## 2024-05-23 NOTE — Telephone Encounter (Signed)
 Pt's medication was sent to pt's pharmacy as requested. Confirmation received.

## 2024-07-14 ENCOUNTER — Other Ambulatory Visit: Payer: Self-pay | Admitting: Cardiovascular Disease

## 2024-07-28 ENCOUNTER — Other Ambulatory Visit: Payer: Self-pay | Admitting: Cardiovascular Disease
